# Patient Record
Sex: Female | Born: 1950 | Race: White | Hispanic: No | Smoking: Never smoker
Health system: Southern US, Community
[De-identification: ages and names within clinical notes are randomized; demographics above are authoritative.]

## PROBLEM LIST (undated history)

## (undated) DIAGNOSIS — I10 Essential (primary) hypertension: Secondary | ICD-10-CM

## (undated) DIAGNOSIS — E119 Type 2 diabetes mellitus without complications: Secondary | ICD-10-CM

## (undated) DIAGNOSIS — J449 Chronic obstructive pulmonary disease, unspecified: Secondary | ICD-10-CM

## (undated) DIAGNOSIS — J45909 Unspecified asthma, uncomplicated: Secondary | ICD-10-CM

## (undated) HISTORY — PX: TOTAL HIP ARTHROPLASTY: SHX124

## (undated) HISTORY — PX: REPLACEMENT TOTAL KNEE: SUR1224

## (undated) HISTORY — PX: TOTAL KNEE ARTHROPLASTY: SHX125

---

## 2015-12-28 ENCOUNTER — Emergency Department: Payer: Medicare Other

## 2015-12-28 ENCOUNTER — Inpatient Hospital Stay: Payer: Medicare Other

## 2015-12-28 ENCOUNTER — Inpatient Hospital Stay
Admission: EM | Admit: 2015-12-28 | Discharge: 2015-12-30 | DRG: 557 | Disposition: A | Discharge status: Home Health | Payer: Medicare Other | Attending: Internal Medicine | Admitting: Internal Medicine

## 2015-12-28 DIAGNOSIS — K859 Acute pancreatitis without necrosis or infection, unspecified: Secondary | ICD-10-CM | POA: Diagnosis present

## 2015-12-28 DIAGNOSIS — R109 Unspecified abdominal pain: Secondary | ICD-10-CM

## 2015-12-28 DIAGNOSIS — R7989 Other specified abnormal findings of blood chemistry: Secondary | ICD-10-CM | POA: Diagnosis not present

## 2015-12-28 DIAGNOSIS — W1830XA Fall on same level, unspecified, initial encounter: Secondary | ICD-10-CM | POA: Diagnosis present

## 2015-12-28 DIAGNOSIS — W19XXXA Unspecified fall, initial encounter: Secondary | ICD-10-CM

## 2015-12-28 DIAGNOSIS — E872 Acidosis: Secondary | ICD-10-CM | POA: Diagnosis present

## 2015-12-28 DIAGNOSIS — E119 Type 2 diabetes mellitus without complications: Secondary | ICD-10-CM | POA: Diagnosis present

## 2015-12-28 DIAGNOSIS — Z880 Allergy status to penicillin: Secondary | ICD-10-CM

## 2015-12-28 DIAGNOSIS — K760 Fatty (change of) liver, not elsewhere classified: Secondary | ICD-10-CM | POA: Diagnosis present

## 2015-12-28 DIAGNOSIS — M6282 Rhabdomyolysis: Principal | ICD-10-CM | POA: Diagnosis present

## 2015-12-28 DIAGNOSIS — I10 Essential (primary) hypertension: Secondary | ICD-10-CM | POA: Diagnosis present

## 2015-12-28 DIAGNOSIS — E279 Disorder of adrenal gland, unspecified: Secondary | ICD-10-CM | POA: Diagnosis present

## 2015-12-28 DIAGNOSIS — J449 Chronic obstructive pulmonary disease, unspecified: Secondary | ICD-10-CM | POA: Diagnosis present

## 2015-12-28 DIAGNOSIS — Z888 Allergy status to other drugs, medicaments and biological substances status: Secondary | ICD-10-CM

## 2015-12-28 DIAGNOSIS — R531 Weakness: Secondary | ICD-10-CM | POA: Diagnosis present

## 2015-12-28 DIAGNOSIS — M25552 Pain in left hip: Secondary | ICD-10-CM

## 2015-12-28 DIAGNOSIS — Y92002 Bathroom of unspecified non-institutional (private) residence single-family (private) house as the place of occurrence of the external cause: Secondary | ICD-10-CM

## 2015-12-28 DIAGNOSIS — M6281 Muscle weakness (generalized): Secondary | ICD-10-CM

## 2015-12-28 DIAGNOSIS — Z96642 Presence of left artificial hip joint: Secondary | ICD-10-CM | POA: Diagnosis present

## 2015-12-28 DIAGNOSIS — T730XXA Starvation, initial encounter: Secondary | ICD-10-CM | POA: Diagnosis present

## 2015-12-28 DIAGNOSIS — R748 Abnormal levels of other serum enzymes: Secondary | ICD-10-CM | POA: Diagnosis present

## 2015-12-28 DIAGNOSIS — K838 Other specified diseases of biliary tract: Secondary | ICD-10-CM | POA: Diagnosis present

## 2015-12-28 DIAGNOSIS — R935 Abnormal findings on diagnostic imaging of other abdominal regions, including retroperitoneum: Secondary | ICD-10-CM | POA: Diagnosis not present

## 2015-12-28 DIAGNOSIS — Y92009 Unspecified place in unspecified non-institutional (private) residence as the place of occurrence of the external cause: Secondary | ICD-10-CM

## 2015-12-28 DIAGNOSIS — Z7984 Long term (current) use of oral hypoglycemic drugs: Secondary | ICD-10-CM | POA: Diagnosis not present

## 2015-12-28 DIAGNOSIS — E876 Hypokalemia: Secondary | ICD-10-CM | POA: Diagnosis present

## 2015-12-28 DIAGNOSIS — E860 Dehydration: Secondary | ICD-10-CM | POA: Diagnosis present

## 2015-12-28 DIAGNOSIS — R945 Abnormal results of liver function studies: Secondary | ICD-10-CM

## 2015-12-28 DIAGNOSIS — Z96653 Presence of artificial knee joint, bilateral: Secondary | ICD-10-CM | POA: Diagnosis present

## 2015-12-28 HISTORY — DX: Essential (primary) hypertension: I10

## 2015-12-28 HISTORY — DX: Chronic obstructive pulmonary disease, unspecified: J44.9

## 2015-12-28 HISTORY — DX: Unspecified asthma, uncomplicated: J45.909

## 2015-12-28 HISTORY — DX: Type 2 diabetes mellitus without complications: E11.9

## 2015-12-28 LAB — CBC WITH DIFFERENTIAL/PLATELET
BASOS ABS: 0.1 10*3/uL (ref 0–0.1)
Basophils Relative: 1 %
EOS PCT: 0 %
Eosinophils Absolute: 0 10*3/uL (ref 0–0.7)
HCT: 44.5 % (ref 35.0–47.0)
Hemoglobin: 15.3 g/dL (ref 12.0–16.0)
LYMPHS PCT: 7 %
Lymphs Abs: 0.6 10*3/uL — ABNORMAL LOW (ref 1.0–3.6)
MCH: 30.9 pg (ref 26.0–34.0)
MCHC: 34.3 g/dL (ref 32.0–36.0)
MCV: 90.2 fL (ref 80.0–100.0)
MONO ABS: 0.7 10*3/uL (ref 0.2–0.9)
Monocytes Relative: 7 %
Neutro Abs: 8.2 10*3/uL — ABNORMAL HIGH (ref 1.4–6.5)
Neutrophils Relative %: 85 %
PLATELETS: 138 10*3/uL — AB (ref 150–440)
RBC: 4.94 MIL/uL (ref 3.80–5.20)
RDW: 14.8 % — AB (ref 11.5–14.5)
WBC: 9.7 10*3/uL (ref 3.6–11.0)

## 2015-12-28 LAB — GLUCOSE, CAPILLARY: GLUCOSE-CAPILLARY: 237 mg/dL — AB (ref 65–99)

## 2015-12-28 LAB — COMPREHENSIVE METABOLIC PANEL
ALT: 28 U/L (ref 14–54)
AST: 61 U/L — AB (ref 15–41)
Albumin: 3.9 g/dL (ref 3.5–5.0)
Alkaline Phosphatase: 101 U/L (ref 38–126)
Anion gap: 22 — ABNORMAL HIGH (ref 5–15)
BUN: 24 mg/dL — ABNORMAL HIGH (ref 6–20)
CHLORIDE: 94 mmol/L — AB (ref 101–111)
CO2: 19 mmol/L — ABNORMAL LOW (ref 22–32)
CREATININE: 0.89 mg/dL (ref 0.44–1.00)
Calcium: 9 mg/dL (ref 8.9–10.3)
GFR calc Af Amer: >60 mL/min (ref >60)
Glucose, Bld: 228 mg/dL — ABNORMAL HIGH (ref 65–99)
Potassium: 3.3 mmol/L — ABNORMAL LOW (ref 3.5–5.1)
Sodium: 135 mmol/L (ref 135–145)
TOTAL PROTEIN: 7.2 g/dL (ref 6.5–8.1)
Total Bilirubin: 3.2 mg/dL — ABNORMAL HIGH (ref 0.3–1.2)

## 2015-12-28 LAB — CK: CK TOTAL: 904 U/L — AB (ref 38–234)

## 2015-12-28 LAB — URINALYSIS, COMPLETE (UACMP) WITH MICROSCOPIC
Bilirubin Urine: NEGATIVE
Glucose, UA: >=500 mg/dL — AB
Ketones, ur: 80 mg/dL — AB
Leukocytes, UA: NEGATIVE
NITRITE: NEGATIVE
PROTEIN: 30 mg/dL — AB
Specific Gravity, Urine: 1.018 (ref 1.005–1.030)
pH: 6 (ref 5.0–8.0)

## 2015-12-28 LAB — ETHANOL: Alcohol, Ethyl (B): <5 mg/dL (ref <5)

## 2015-12-28 LAB — LIPASE, BLOOD: LIPASE: 130 U/L — AB (ref 11–51)

## 2015-12-28 MED ORDER — POTASSIUM CHLORIDE CRYS ER 20 MEQ PO TBCR
40.0000 meq | EXTENDED_RELEASE_TABLET | Freq: Once | ORAL | Status: AC
  Administered 2015-12-28: 40 meq via ORAL
  Filled 2015-12-28: qty 2

## 2015-12-28 MED ORDER — VENLAFAXINE HCL ER 75 MG PO CP24
300.0000 mg | ORAL_CAPSULE | Freq: Every day | ORAL | Status: DC
  Administered 2015-12-28 – 2015-12-30 (×3): 300 mg via ORAL
  Filled 2015-12-28 (×2): qty 4
  Filled 2015-12-28: qty 2
  Filled 2015-12-28 (×2): qty 4

## 2015-12-28 MED ORDER — OXYCODONE-ACETAMINOPHEN 5-325 MG PO TABS
ORAL_TABLET | ORAL | Status: AC
  Administered 2015-12-28: 1 via ORAL
  Filled 2015-12-28: qty 1

## 2015-12-28 MED ORDER — ONDANSETRON HCL 4 MG PO TABS: 4.0000 mg | ORAL_TABLET | Freq: Four times a day (QID) | ORAL | Status: DC | PRN

## 2015-12-28 MED ORDER — OXYCODONE-ACETAMINOPHEN 5-325 MG PO TABS
1.0000 | ORAL_TABLET | ORAL | Status: AC
  Administered 2015-12-28: 1 via ORAL

## 2015-12-28 MED ORDER — ONDANSETRON HCL 4 MG/2ML IJ SOLN: 4.0000 mg | Freq: Four times a day (QID) | INTRAMUSCULAR | Status: DC | PRN

## 2015-12-28 MED ORDER — IOPAMIDOL (ISOVUE-300) INJECTION 61%
15.0000 mL | Freq: Once | INTRAVENOUS | Status: AC | PRN
  Administered 2015-12-28: 15 mL via ORAL

## 2015-12-28 MED ORDER — ACETAMINOPHEN 325 MG PO TABS: 650.0000 mg | ORAL_TABLET | Freq: Four times a day (QID) | ORAL | Status: DC | PRN

## 2015-12-28 MED ORDER — IOPAMIDOL (ISOVUE-300) INJECTION 61%
100.0000 mL | Freq: Once | INTRAVENOUS | Status: AC | PRN
  Administered 2015-12-28: 100 mL via INTRAVENOUS

## 2015-12-28 MED ORDER — ACETAMINOPHEN 650 MG RE SUPP: 650.0000 mg | Freq: Four times a day (QID) | RECTAL | Status: DC | PRN

## 2015-12-28 MED ORDER — INSULIN ASPART 100 UNIT/ML ~~LOC~~ SOLN
3.0000 [IU] | Freq: Once | SUBCUTANEOUS | Status: AC
  Administered 2015-12-28: 3 [IU] via SUBCUTANEOUS
  Filled 2015-12-28: qty 3

## 2015-12-28 MED ORDER — SODIUM CHLORIDE 0.9 % IV BOLUS (SEPSIS)
1000.0000 mL | Freq: Once | INTRAVENOUS | Status: AC
  Administered 2015-12-28: 1000 mL via INTRAVENOUS

## 2015-12-28 MED ORDER — TRAMADOL HCL 50 MG PO TABS
50.0000 mg | ORAL_TABLET | Freq: Four times a day (QID) | ORAL | Status: DC | PRN
  Administered 2015-12-28: 50 mg via ORAL
  Filled 2015-12-28: qty 1

## 2015-12-28 MED ORDER — ENOXAPARIN SODIUM 40 MG/0.4ML ~~LOC~~ SOLN
40.0000 mg | SUBCUTANEOUS | Status: DC
  Administered 2015-12-28 – 2015-12-29 (×2): 40 mg via SUBCUTANEOUS
  Filled 2015-12-28 (×2): qty 0.4

## 2015-12-28 MED ORDER — INSULIN ASPART 100 UNIT/ML ~~LOC~~ SOLN
0.0000 [IU] | Freq: Three times a day (TID) | SUBCUTANEOUS | Status: DC
  Administered 2015-12-29: 2 [IU] via SUBCUTANEOUS
  Administered 2015-12-29 – 2015-12-30 (×2): 3 [IU] via SUBCUTANEOUS
  Administered 2015-12-30 (×2): 2 [IU] via SUBCUTANEOUS
  Filled 2015-12-28 (×3): qty 2
  Filled 2015-12-28 (×2): qty 3

## 2015-12-28 MED ORDER — SODIUM CHLORIDE 0.9 % IV SOLN
INTRAVENOUS | Status: DC
  Administered 2015-12-28 – 2015-12-30 (×2): via INTRAVENOUS

## 2015-12-28 MED ORDER — OXYCODONE-ACETAMINOPHEN 5-325 MG PO TABS
1.0000 | ORAL_TABLET | Freq: Once | ORAL | Status: AC
  Administered 2015-12-28: 1 via ORAL
  Filled 2015-12-28: qty 1

## 2015-12-28 MED ORDER — LISINOPRIL 10 MG PO TABS
10.0000 mg | ORAL_TABLET | Freq: Every day | ORAL | Status: DC
  Administered 2015-12-28 – 2015-12-29 (×2): 10 mg via ORAL
  Filled 2015-12-28 (×2): qty 1

## 2015-12-28 NOTE — ED Notes (Signed)
Patient transported to X-ray 

## 2015-12-28 NOTE — H&P (Signed)
[facility] [place] at [facility]   PATIENT NAME: Alyson Ki    MR#:  [id number]  DATE OF BIRTH:  [birth year]  DATE OF ADMISSION:  12/28/2015  PRIMARY CARE PHYSICIAN: No PCP Per Patient   REQUESTING/REFERRING PHYSICIAN: Dr Sharma Covert  CHIEF COMPLAINT:   I hurt all over. I fell in the bathroom she days ago. HISTORY OF PRESENT ILLNESS:  Jaydence Vanyo  is a 65 y.o. female with a known history of Diabetes, hypertension, COPD, history of alcohol use last drink 2 weeks ago comes to the emergency room after she had a fall 3 days ago in the bathroom. Patient recently moved from [facility] after she sold her house into an apartment and had a fall without any head injury. She crawled to the kitchen and kept herself hydrated by drinking Diet Coke. Her friend was not able to get in touch with her hands called the apartment manager got into the apartment where they found patient lying on the floor. She had some liquidy diarrheal stools for last 2 days. Patient reports she has not eaten for last 3 days. She tells me she was hurting in her hip so bad that she could not get up to get to the phone. Her x-rays of the hip are negative for any fracture. She was found to have elevated CPK is being admitted for acute rhabdomyolysis elevated LFTs and clinical dehydration.  PAST MEDICAL HISTORY:   Past Medical History:  Diagnosis Date  . Asthma   . COPD (chronic obstructive pulmonary disease) (HCC)   . Diabetes mellitus without complication (HCC)   . Hypertension     PAST SURGICAL HISTOIRY:   Past Surgical History:  Procedure Laterality Date  . REPLACEMENT TOTAL KNEE Left   . TOTAL HIP ARTHROPLASTY Left   . TOTAL HIP ARTHROPLASTY Left   . TOTAL KNEE ARTHROPLASTY Right     SOCIAL HISTORY:   Social History  Substance Use Topics  . Smoking status: Never Smoker  . Smokeless tobacco: Never Used  . Alcohol use No    FAMILY HISTORY:   Family History  Problem Relation Age  of Onset  . Heart attack Father     DRUG ALLERGIES:   Allergies  Allergen Reactions  . Penicillins Shortness Of Breath  . Amoxicillin Rash  . Ciprofloxacin Rash    REVIEW OF SYSTEMS:  Review of Systems  Constitutional: Positive for malaise/fatigue. Negative for chills, fever and weight loss.  HENT: Negative for ear discharge, ear pain and nosebleeds.   Eyes: Negative for blurred vision, pain and discharge.  Respiratory: Negative for sputum production, shortness of breath, wheezing and stridor.   Cardiovascular: Negative for chest pain, palpitations, orthopnea and PND.  Gastrointestinal: Positive for abdominal pain and diarrhea. Negative for nausea and vomiting.  Genitourinary: Negative for frequency and urgency.  Musculoskeletal: Positive for falls. Negative for back pain and joint pain.  Neurological: Positive for weakness. Negative for sensory change, speech change and focal weakness.  Psychiatric/Behavioral: Negative for depression and hallucinations. The patient is not nervous/anxious.      MEDICATIONS AT HOME:   Prior to Admission medications   Medication Sig Start Date End Date Taking? Authorizing Provider  hydrochlorothiazide (HYDRODIURIL) 25 MG tablet Take 12.5 mg by mouth daily.   Yes Historical Provider, MD  lisinopril (PRINIVIL,ZESTRIL) 10 MG tablet Take 10 mg by mouth daily.   Yes Historical Provider, MD  metFORMIN (GLUCOPHAGE) 500 MG tablet Take 500 mg by mouth 2 (two) times daily.  Yes Historical Provider, MD  traZODone (DESYREL) 50 MG tablet Take 100 mg by mouth at bedtime.   Yes Historical Provider, MD  venlafaxine (EFFEXOR) 75 MG tablet Take 300 mg by mouth daily. Take 4 caps (300mg ) daily   Yes Historical Provider, MD      VITAL SIGNS:  Blood pressure (!) 147/90, pulse (!) 113, temperature 97.8 F (36.6 C), temperature source Oral, resp. rate 20, height 5' (1.524 m), weight 90.7 kg (200 lb), SpO2 100 %.  PHYSICAL EXAMINATION:  GENERAL:  65 y.o.-year-old  patient lying in the bed with no acute distress.  EYES: Pupils equal, round, reactive to light and accommodation. No scleral icterus. Extraocular muscles intact.  HEENT: Head atraumatic, normocephalic. Oropharynx and nasopharynx clear. Oral mucosa dry NECK:  Supple, no jugular venous distention. No thyroid enlargement, no tenderness.  LUNGS: Normal breath sounds bilaterally, no wheezing, rales,rhonchi or crepitation. No use of accessory muscles of respiration.  CARDIOVASCULAR: S1, S2 normal. No murmurs, rubs, or gallops.  ABDOMEN: Soft, nontender, nondistended. Bowel sounds present. No organomegaly or mass.  EXTREMITIES: No pedal edema, cyanosis, or clubbing.  NEUROLOGIC: Cranial nerves II through XII are intact. Muscle strength 5/5 in all extremities. Sensation intact. Gait not checked.  PSYCHIATRIC: The patient is alert and oriented x 3.  SKIN: No obvious rash, lesion, or ulcer.   LABORATORY PANEL:   CBC  Recent Labs Lab 12/28/15 1407  WBC 9.7  HGB 15.3  HCT 44.5  PLT 138*   ------------------------------------------------------------------------------------------------------------------  Chemistries   Recent Labs Lab 12/28/15 1407  NA 135  K 3.3*  CL 94*  CO2 19*  GLUCOSE 228*  BUN 24*  CREATININE 0.89  CALCIUM 9.0  AST 61*  ALT 28  ALKPHOS 101  BILITOT 3.2*   ------------------------------------------------------------------------------------------------------------------  Cardiac Enzymes No results for input(s): TROPONINI in the last 168 hours. ------------------------------------------------------------------------------------------------------------------  RADIOLOGY:  Dg Chest 1 View  Result Date: 12/28/2015 CLINICAL DATA:  Fall and generalized weakness. EXAM: CHEST 1 VIEW COMPARISON:  None. FINDINGS: The heart size and mediastinal contours are within normal limits. Lungs show bibasilar atelectasis. There is no evidence of pulmonary edema, consolidation,  pneumothorax, nodule or pleural fluid. Healed bilateral rib fractures evident. IMPRESSION: No acute findings.  Bibasilar atelectasis. Electronically Signed   By: Irish LackGlenn  Yamagata M.D.   On: 12/28/2015 13:22   Ct Abdomen Pelvis W Contrast  Result Date: 12/28/2015 CLINICAL DATA:  Fall with weakness, nausea and vomiting EXAM: CT ABDOMEN AND PELVIS WITH CONTRAST TECHNIQUE: Multidetector CT imaging of the abdomen and pelvis was performed using the standard protocol following bolus administration of intravenous contrast. CONTRAST:  100mL ISOVUE-300 IOPAMIDOL (ISOVUE-300) INJECTION 61% COMPARISON:  Radiographs 12/28/2015 FINDINGS: Lower chest: Visualized lung bases demonstrate no acute consolidation or pleural effusion. Hepatobiliary: There is diffuse decreased density of the hepatic parenchyma, consistent with fatty infiltration. No focal hepatic abnormality. There is somewhat small appearance of the right hepatic lobe, correlate with surgical history. Irregular enhancement within the right hepatic lobe this felt to relate to unusual appearance of vascular enhancement, likely hepatic venous. There is no focal hepatic abnormality. Dilated extrahepatic common bile duct, measuring up to 2 cm in diameter. A well-formed gallbladder is not clearly identified. There is slight prominence of the pancreatic duct at the head of the pancreas. No calcified stones. Pancreas: Atrophic. Prominent pancreatic duct. There is a 1.5 cm cystic appearing lesion in the tail of the pancreas, series 2, image number 22. No peripancreatic inflammation. Spleen: Normal in size without focal abnormality. Adrenals/Urinary  Tract: Left adrenal gland is within normal limits. 2.6 cm right adrenal gland mass without demonstrable fatty density. Bilateral kidneys are within normal limits. Bladder is largely obscured by streak artifact from hip replacement. Stomach/Bowel: Stomach is nonenlarged. No dilated bowel. Diverticular disease of the colon without  acute inflammation. Appendix is not well visualized. Vascular/Lymphatic: Tortuous pelvic and gonadal vessels. Atherosclerosis of the aorta. No abnormally enlarged lymph nodes. Reproductive: Uterus and bilateral adnexa are unremarkable. Other: No free air or free fluid Musculoskeletal: Bilateral hip replacements. Deformity of L2 vertebral body with osseous bridging anteriorly at L1 and L2, possibly related to old trauma or congenital malformation. IMPRESSION: 1. Fatty infiltration of the liver. 2. Markedly enlargement of extrahepatic common bowel duct up to 2 cm with slight prominence of pancreatic duct. Recommend correlation with laboratory values to evaluate for obstruction. Further evaluation with MR or ERCP may be obtained as clinically indicated 3. 2.6 cm right adrenal gland mass. Nonemergent evaluation with adrenal CT or MR may be obtained. 4. Atrophic pancreas. 1.5 cm cystic lesion in the tail of the pancreas, for which nonemergent follow-up CT or MRI pancreas protocol is recommended. Electronically Signed   By: Jasmine PangKim  Fujinaga M.D.   On: 12/28/2015 16:48   Dg Hip Unilat W Or Wo Pelvis 2-3 Views Left  Result Date: 12/28/2015 CLINICAL DATA:  The patient slipped in a bathroom 3 days ago resulting in a fall and left hip pain. Initial encounter. EXAM: DG HIP (WITH OR WITHOUT PELVIS) 2-3V LEFT COMPARISON:  None. FINDINGS: Bilateral total hip replacements are in place. Hardware is intact. No acute abnormality is identified. IMPRESSION: No acute abnormality. Status post bilateral total hip replacement. Electronically Signed   By: Drusilla Kannerhomas  Dalessio M.D.   On: 12/28/2015 13:22   Dg Femur Min 2 Views Right  Result Date: 12/28/2015 CLINICAL DATA:  Right hip and upper leg pain since a fall in bathroom 3 days ago. Initial encounter. EXAM: RIGHT FEMUR 2 VIEWS COMPARISON:  None. FINDINGS: No acute bony or joint abnormality is identified. Right total hip and knee replacements are in place. Hardware is intact.  IMPRESSION: No acute abnormality. Electronically Signed   By: Drusilla Kannerhomas  Dalessio M.D.   On: 12/28/2015 13:24    EKG:    IMPRESSION AND PLAN:   Jimmye Normanhyllis Schnitzler  is a 65 y.o. female with a known history of Diabetes, hypertension, COPD, history of alcohol use last drink 2 weeks ago comes to the emergency room after she had a fall 3 days ago in the bathroom. [place] recently moved [place] [facility] after she sold her house into an apartment and had a fall without any head injury.   1. Acute  Rhabdomyolysis due to fall and crawling with laying in the kitchen and living area for 3 days -Admit to medical floor -IV fluids -Monitor I's and O's and creatinine  2. Hypokalemia -Replete with IV fluids  3. Elevated LFT with dilated CBD seen on CT of the abdomen along with right upper quadrant abdominal pain -Patient has mild elevated lipase. -pt has h/o drinking ETOH. Last drink 2 weeks ago. -get US of the abdomen -consider GI consult depending on US results  4.DM-2  SSI  -hold metformin due to acidosis  5. Clinical dehydration with acidosis which appears due to  Starvation (not eatin for 3 days) -monitor AG  6.DVT prophylaxis -SQ lovenox  No family in the ER  CM and PT to see All the records are reviewed and case discussed with ED provider. Management plans  discussed with the patient, family and they are in agreement.  CODE STATUS: FULL  TOTAL TIME TAKING CARE OF THIS PATIENT: 50 minutes.    Angelic Schnelle M.D on 12/28/2015 at 6:08 PM  Between 7am to 6pm - Pager - [phone]  After 6pm go to [email or web address] - password [email or web address] [facility]  [facility]  Office  [phone]  CC: Primary care physician; No PCP Per Patient

## 2015-12-28 NOTE — ED Triage Notes (Signed)
Pt to ED via [facility] c/o fall. Per EMS pt fell a couple days ago, and was unable to get up. Pt reports slipping in bathroom and unable to get up, but was able to crawl to her kitchen from her bathroom, and back. She also c/o back and bilateral leg pain, nausea and diarrhea. Pt alert and oriented in no acute distress at this time.

## 2015-12-28 NOTE — ED Provider Notes (Signed)
[facility] Emergency Department Provider Note  ____________________________________________  Time seen: Approximately 3:19 PM  I have reviewed the triage vital signs and the nursing notes.   HISTORY  Chief Complaint Fall    HPI Shelby Stewart is a 65 y.o. female comes ED complaining of low back pain and bilateral hip pain after a fall at home. She reports that she's been having generalized weakness over the last several days, and 3 days ago she had a fall in her bathroom. She was unable to get back up so she laid on the floor for 3 days until her daughter came to visit her today. She was able to crawl to the ktichen to drink diet cokes to maintain hydration and then would crawl back to the living room to continue lying on the floor.  Denies head injury headache or loss of consciousness. No neck pain or stiffness. Positive generalized abdominal pain.     Past Medical History:  Diagnosis Date  . COPD (chronic obstructive pulmonary disease) (HCC)   . Diabetes mellitus without complication (HCC)   . Hypertension      There are no active problems to display for this patient.    Past Surgical History:  Procedure Laterality Date  . REPLACEMENT TOTAL KNEE Left   . TOTAL HIP ARTHROPLASTY Left   . TOTAL HIP ARTHROPLASTY Left   . TOTAL KNEE ARTHROPLASTY Right      Prior to Admission medications   Not on File     Allergies Penicillins; Amoxicillin; and Ciprofloxacin   Family History  Problem Relation Age of Onset  . Heart attack Father     Social History Social History  Substance Use Topics  . Smoking status: Never Smoker  . Smokeless tobacco: Never Used  . Alcohol use No    Review of Systems  Constitutional:   No fever or chills.  ENT:   No sore throat. No rhinorrhea. Cardiovascular:   No chest pain. Respiratory:   No dyspnea or cough. Gastrointestinal:   positive abdominal pain. Positive diarrhea 2 days.  Genitourinary:    Negative for dysuria or difficulty urinating. Musculoskeletal:   Positive bilateral hip pain and low back pain Neurological:   Negative for headaches 10-point ROS otherwise negative.  ____________________________________________   PHYSICAL EXAM:  VITAL SIGNS: ED Triage Vitals  Enc Vitals Group     BP 12/28/15 1214 (!) 155/92     Pulse Rate 12/28/15 1214 (!) 103     Resp 12/28/15 1214 20     Temp 12/28/15 1214 97.8 F (36.6 C)     Temp Source 12/28/15 1214 Oral     SpO2 12/28/15 1214 99 %     Weight 12/28/15 1215 200 lb (90.7 kg)     Height 12/28/15 1215 5' (1.524 m)     Head Circumference --      Peak Flow --      Pain Score 12/28/15 1215 8     Pain Loc --      Pain Edu? --      Excl. in GC? --     Vital signs reviewed, nursing assessments reviewed.   Constitutional:   Alert and oriented. Well appearing and in no distress. Eyes:   No scleral icterus. No conjunctival pallor. PERRL. EOMI.  No nystagmus. ENT   Head:   Normocephalic and atraumatic.   Nose:   No congestion/rhinnorhea. No septal hematoma   Mouth/Throat:   dRy mucous membranes, no pharyngeal erythema. No peritonsillar mass.  Neck:   No stridor. No SubQ emphysema. No meningismus. Hematological/Lymphatic/Immunilogical:   No cervical lymphadenopathy. Cardiovascular:   Tachycardia heart rate 105. Symmetric bilateral radial and DP pulses.  No murmurs.  Respiratory:   Normal respiratory effort without tachypnea nor retractions. Breath sounds are clear and equal bilaterally. No wheezes/rales/rhonchi. Gastrointestinal:   Soft with generalized tenderness. Non distended. There is no CVA tenderness.  No rebound, rigidity, or guarding. Genitourinary:   deferred Musculoskeletal:   Tenderness of bilateral hips. No midline spinal tenderness. Intact range of motion, patient is able to lift both legs. Neurologic:   Normal speech and language.  CN 2-10 normal. Motor grossly intact. No gross focal neurologic  deficits are appreciated.  Skin:    Skin is warm, dry and intact. No rash noted.  No petechiae, purpura, or bullae.  ____________________________________________    LABS (pertinent positives/negatives) (all labs ordered are listed, but only abnormal results are displayed) Labs Reviewed  COMPREHENSIVE METABOLIC PANEL - Abnormal; Notable for the following:       Result Value   Potassium 3.3 (*)    Chloride 94 (*)    CO2 19 (*)    Glucose, Bld 228 (*)    BUN 24 (*)    AST 61 (*)    Total Bilirubin 3.2 (*)    Anion gap 22 (*)    All other components within normal limits  LIPASE, BLOOD - Abnormal; Notable for the following:    Lipase 130 (*)    All other components within normal limits  CBC WITH DIFFERENTIAL/PLATELET - Abnormal; Notable for the following:    RDW 14.8 (*)    Platelets 138 (*)    Neutro Abs 8.2 (*)    Lymphs Abs 0.6 (*)    All other components within normal limits  URINALYSIS, COMPLETE (UACMP) WITH MICROSCOPIC - Abnormal; Notable for the following:    Color, Urine YELLOW (*)    APPearance CLEAR (*)    Glucose, UA >=500 (*)    Hgb urine dipstick SMALL (*)    Ketones, ur 80 (*)    Protein, ur 30 (*)    Bacteria, UA MANY (*)    Squamous Epithelial / LPF 0-5 (*)    All other components within normal limits  CK - Abnormal; Notable for the following:    Total CK 904 (*)    All other components within normal limits  URINE CULTURE  ETHANOL   ____________________________________________   EKG    ____________________________________________    RADIOLOGY  X-ray pelvis and left hip unremarkable X-ray right femur unremarkable Chest x-ray unremarkable CT abdomen and pelvis pending  ____________________________________________   PROCEDURES Procedures  ____________________________________________   INITIAL IMPRESSION / ASSESSMENT AND PLAN / ED COURSE  Pertinent labs & imaging results that were available during my care of the patient were reviewed by  me and considered in my medical decision making (see chart for details).  Patient presents with muscle skeletal pain and abdominal pain after falling on the ground and remaining on the ground for 3 days due to generalized weakness. Has some evidence of dehydration, we'll give IV fluids. Labs reveal elevated CK and lipase. Creatinine is preserved, but there is concern for rhabdomyolysis.  Concern for biliary pathology with diarrhea elevated lipase elevated bilirubin abdominal pain. We'll get a CT abdomen and pelvis.    ----------------------------------------- 3:55 PM on 12/28/2015 ----------------------------------------- Case discussed with hospitalist for eventual admission due to dehydration, rhabdomyolysis, pancreatitis in the setting of generalized weakness. Case discussed with Dr.  Sharma Covertorman to follow up on CT prior to admission.   Clinical Course    ____________________________________________   FINAL CLINICAL IMPRESSION(S) / ED DIAGNOSES  Final diagnoses:  Fall in home, initial encounter  Generalized weakness  Non-traumatic rhabdomyolysis  Acute pancreatitis, unspecified complication status, unspecified pancreatitis type      New Prescriptions   No medications on file     Portions of this note were generated with dragon dictation software. Dictation errors may occur despite best attempts at proofreading.    Sharman CheekPhillip Telecia Larocque, MD 12/28/15 [phone]

## 2015-12-29 ENCOUNTER — Inpatient Hospital Stay: Payer: Medicare Other

## 2015-12-29 DIAGNOSIS — R935 Abnormal findings on diagnostic imaging of other abdominal regions, including retroperitoneum: Secondary | ICD-10-CM

## 2015-12-29 DIAGNOSIS — R7989 Other specified abnormal findings of blood chemistry: Secondary | ICD-10-CM

## 2015-12-29 LAB — COMPREHENSIVE METABOLIC PANEL
ALT: 21 U/L (ref 14–54)
ANION GAP: 13 (ref 5–15)
AST: 40 U/L (ref 15–41)
Albumin: 3.1 g/dL — ABNORMAL LOW (ref 3.5–5.0)
Alkaline Phosphatase: 73 U/L (ref 38–126)
BUN: 17 mg/dL (ref 6–20)
CHLORIDE: 99 mmol/L — AB (ref 101–111)
CO2: 22 mmol/L (ref 22–32)
Calcium: 8 mg/dL — ABNORMAL LOW (ref 8.9–10.3)
Creatinine, Ser: 0.75 mg/dL (ref 0.44–1.00)
Glucose, Bld: 179 mg/dL — ABNORMAL HIGH (ref 65–99)
POTASSIUM: 3.4 mmol/L — AB (ref 3.5–5.1)
Sodium: 134 mmol/L — ABNORMAL LOW (ref 135–145)
TOTAL PROTEIN: 5.6 g/dL — AB (ref 6.5–8.1)
Total Bilirubin: 2.4 mg/dL — ABNORMAL HIGH (ref 0.3–1.2)

## 2015-12-29 LAB — GLUCOSE, CAPILLARY
GLUCOSE-CAPILLARY: 213 mg/dL — AB (ref 65–99)
GLUCOSE-CAPILLARY: 179 mg/dL — AB (ref 65–99)
Glucose-Capillary: 188 mg/dL — ABNORMAL HIGH (ref 65–99)

## 2015-12-29 LAB — BILIRUBIN, FRACTIONATED(TOT/DIR/INDIR)
BILIRUBIN DIRECT: 0.6 mg/dL — AB (ref 0.1–0.5)
BILIRUBIN INDIRECT: 1.4 mg/dL — AB (ref 0.3–0.9)
BILIRUBIN TOTAL: 2 mg/dL — AB (ref 0.3–1.2)

## 2015-12-29 MED ORDER — POTASSIUM CHLORIDE CRYS ER 20 MEQ PO TBCR
20.0000 meq | EXTENDED_RELEASE_TABLET | Freq: Two times a day (BID) | ORAL | Status: AC
  Administered 2015-12-29 (×2): 20 meq via ORAL
  Filled 2015-12-29 (×2): qty 1

## 2015-12-29 MED ORDER — SULFAMETHOXAZOLE-TRIMETHOPRIM 800-160 MG PO TABS
1.0000 | ORAL_TABLET | Freq: Two times a day (BID) | ORAL | Status: DC
  Administered 2015-12-29 – 2015-12-30 (×2): 1 via ORAL
  Filled 2015-12-29 (×2): qty 1

## 2015-12-29 NOTE — Progress Notes (Signed)
Pt ambulated around the unit with physical therapy, stated she tolerated this well, but feels tired afterwards.

## 2015-12-29 NOTE — Plan of Care (Signed)
Problem: Bowel/Gastric: Goal: Will not experience complications related to bowel motility Outcome: Progressing Pt is progressing toward goals of discharge. Pt remains free of falls/injury this shift, has been cooperative with testing and care. Pt is aware of plan of care.

## 2015-12-29 NOTE — Progress Notes (Signed)
Initial Nutrition Assessment  DOCUMENTATION CODES:   Obesity unspecified  INTERVENTION:  1. Ensure Enlive po BID, each supplement provides 350 kcal and 20 grams of protein  NUTRITION DIAGNOSIS:   Inadequate oral intake related to poor appetite, other (see comment) (attempting weight loss) as evidenced by per patient/family report.  GOAL:   Patient will meet greater than or equal to 90% of their needs  MONITOR:   PO intake, I & O's, Labs, Weight trends, Supplement acceptance  REASON FOR ASSESSMENT:   Malnutrition Screening Tool   ASSESSMENT:   Shelby Stewart  is a 65 y.o. female with a known history of Diabetes, hypertension, COPD, history of alcohol use last drink 2 weeks ago comes to the emergency room after she had a fall 3 days ago in the bathroom.  Spoke with pt at bedside. She reports weight fluctuations - but that she generally weights 210-220# Apparently she was trying to lose weight, has lost weight down from 300# to where she currently is. Prior to her fall - she had not eaten anything for "a few days." Patient reports that she was trying to lose weight and when she eats she feels a desire to continue to eat so she tends to not eat anything.  This seems along the lines of the disordered eating spectrum - no evidence of history of this in chart.  This morning she 1/2 a cup of soup - states she got full quickly. Appetite is ok. Complains of some bloating - looking at her abdomen it is distended, kind of taut. Patient states she hasn't had a BM in a few days.  Nutrition-Focused physical exam completed. Findings are no fat depletion, no muscle depletion, and no edema.   Labs and medications reviewed: Na 134, K 3.4, Tot bili 2.4 NS @ 6575mL/hr  Diet Order:  Diet clear liquid Room service appropriate? Yes; Fluid consistency: Thin  Skin:  Reviewed, no issues  Last BM:  12/26/2015  Height:   Ht Readings from Last 1 Encounters:  12/28/15 5' (1.524 m)    Weight:    Wt Readings from Last 1 Encounters:  12/28/15 212 lb 11.2 oz (96.5 kg)    Ideal Body Weight:  45.45 kg  BMI:  Body mass index is 41.54 kg/m.  Estimated Nutritional Needs:   Kcal:  1455-1755 calories  Protein:  96-116 gm  Fluid:  >/= 1.45L  EDUCATION NEEDS:   No education needs identified at this time  Dionne AnoWilliam M. Elzy Tomasello, MS, RD LDN Inpatient Clinical Dietitian Pager [phone]

## 2015-12-29 NOTE — Progress Notes (Signed)
New Admit  Arrival Method: ED Mental Orientation: Alert and oriented X4 Telemetry: No Assessment: Clear lung sounds, no adventitious heart sounds, voiding clear yellow urine. Skin: Bruises on arms bilaterally, Generalized redness of skin on back and legs, Moisture associated skin damage to inner buttocks- cleansed and barrier cream applied. Iv: Right antecubital Safety Measures: Bed alarm on, yellow socks, Patient oriented to unit and equipment. Admission: Complete

## 2015-12-29 NOTE — Progress Notes (Signed)
Physical Therapy Evaluation Patient Details Name: Shelby Stewart MRN: [id number] DOB: Oct 15, 1950 Today's Date: 12/29/2015   History of Present Illness  Pt admitted for elevated LFTs and complaints of falls in a bathroom. Pt with history of DM, HTN, COPD, and alcohol abuse.  Clinical Impression  Pt is a pleasant 65 year old female who was admitted for elevated LFTs. Pt performs bed mobility with mod I and transfers/ambulation with cga and RW. Pt demonstrates deficits with strength/mobility/endurance/pain. Pt is near baseline level. Would benefit from skilled PT to address above deficits and promote optimal return to PLOF. Recommend transition to HHPT upon discharge from acute hospitalization.       Follow Up Recommendations Home health PT    Equipment Recommendations  None recommended by PT    Recommendations for Other Services       Precautions / Restrictions Precautions Precautions: Fall Restrictions Weight Bearing Restrictions: No      Mobility  Bed Mobility Overal bed mobility: Modified Independent             General bed mobility comments: safe technique performed, slightly impulsive  Transfers Overall transfer level: Needs assistance Equipment used: Rolling walker (2 wheeled) Transfers: Sit to/from Stand Sit to Stand: Min guard         General transfer comment: safe technique performed with upright posture noted. Once standing, reports increased pain on L hip.  Ambulation/Gait Ambulation/Gait assistance: Min guard Ambulation Distance (Feet): 120 Feet Assistive device: Rolling walker (2 wheeled) Gait Pattern/deviations: Step-through pattern     General Gait Details: ambulated using reciprocal gait pattern and safe technique. Pt able to carry conversation during ambulation. Increased pain noted limiting further distance.   Stairs            Wheelchair Mobility    Modified Rankin (Stroke Patients Only)       Balance Overall balance  assessment: Needs assistance Sitting-balance support: Feet supported Sitting balance-Leahy Scale: Normal     Standing balance support: Bilateral upper extremity supported Standing balance-Leahy Scale: Good                               Pertinent Vitals/Pain Pain Assessment: 0-10 Pain Score: 7  Pain Location: L hip Pain Descriptors / Indicators: Dull;Discomfort Pain Intervention(s): Limited activity within patient's tolerance    Home Living Family/patient expects to be discharged to:: Private residence Living Arrangements: Alone   Type of Home: House Home Access: Stairs to enter Entrance Stairs-Rails: Right Entrance Stairs-Number of Steps: 3 Home Layout: Two level Home Equipment: Environmental consultantWalker - 2 wheels;Cane - single point;Shower seat      Prior Function Level of Independence: Independent with assistive device(s)         Comments: uses SPC     Hand Dominance        Extremity/Trunk Assessment   Upper Extremity Assessment: Overall WFL for tasks assessed           Lower Extremity Assessment: Generalized weakness (B LE grossly 4+/5)         Communication   Communication: No difficulties  Cognition Arousal/Alertness: Awake/alert Behavior During Therapy: WFL for tasks assessed/performed Overall Cognitive Status: Within Functional Limits for tasks assessed                      General Comments      Exercises Other Exercises Other Exercises: Supine/seated ther-ex performed including B LE alt. marches, LAQ, hip ab/ad, and ankle  pumps. All ther-ex performed x 10 reps with supervision and safe technique   Assessment/Plan    PT Assessment Patient needs continued PT services  PT Problem List Decreased strength;Pain;Decreased mobility          PT Treatment Interventions Gait training;Stair training;Therapeutic exercise    PT Goals (Current goals can be found in the Care Plan section)  Acute Rehab PT Goals Patient Stated Goal: to go  home PT Goal Formulation: With patient Time For Goal Achievement: 01/12/16 Potential to Achieve Goals: Good    Frequency Min 2X/week   Barriers to discharge        Co-evaluation               End of Session Equipment Utilized During Treatment: Gait belt Activity Tolerance: Patient limited by pain Patient left: in chair;with chair alarm set Nurse Communication: Mobility status         Time: [id number] PT Time Calculation (min) (ACUTE ONLY): 23 min   Charges:   PT Evaluation $PT Eval Moderate Complexity: 1 Procedure PT Treatments $Therapeutic Exercise: 8-22 mins   PT G Codes:        Shelby Stewart 12/29/2015, 3:53 PM  Elizabeth PalauStephanie Jasper Stewart, PT, DPT [phone]

## 2015-12-29 NOTE — Progress Notes (Signed)
[facility] - [place] [facility]   PATIENT NAME: Shelby Stewart    MR#:  [id number]  DATE OF BIRTH:  [birth year]  SUBJECTIVE:  CHIEF COMPLAINT:   Chief Complaint  Patient presents with  . Fall     Patient is not a great historian, came after a fall and could not get up from the floor for 3 days, she kept drinking liquids and crawling on the floor but did not call for any help. She denies any abdominal pain but found to have elevated LFTs and high CK level with elevated lipase level also. Other workup on CT abdomen and ultrasound abdomen was common bile duct was noted to be severely Dilated. Tolerating liquid diet now. , Workup was negative for any acute fractures, she still to complain of lower back and leg pains.  REVIEW OF SYSTEMS:  CONSTITUTIONAL: No fever, fatigue or weakness.  EYES: No blurred or double vision.  EARS, NOSE, AND THROAT: No tinnitus or ear pain.  RESPIRATORY: No cough, shortness of breath, wheezing or hemoptysis.  CARDIOVASCULAR: No chest pain, orthopnea, edema.  GASTROINTESTINAL: No nausea, vomiting, diarrhea or abdominal pain.  GENITOURINARY: No dysuria, hematuria.  ENDOCRINE: No polyuria, nocturia,  HEMATOLOGY: No anemia, easy bruising or bleeding SKIN: No rash or lesion. MUSCULOSKELETAL: c/o back pain.   NEUROLOGIC: No tingling, numbness, weakness.  PSYCHIATRY: No anxiety or depression.   ROS  DRUG ALLERGIES:   Allergies  Allergen Reactions  . Penicillins Shortness Of Breath  . Amoxicillin Rash  . Ciprofloxacin Rash    VITALS:  Blood pressure 108/60, pulse 87, temperature 97.9 F (36.6 C), temperature source Oral, resp. rate 16, height 5' (1.524 m), weight 96.5 kg (212 lb 11.2 oz), SpO2 98 %.  PHYSICAL EXAMINATION:  GENERAL:  65 y.o.-year-old patient lying in the bed with no acute distress.  EYES: Pupils equal, round, reactive to light and accommodation. No scleral icterus. Extraocular muscles intact.  HEENT: Head atraumatic,  normocephalic. Oropharynx and nasopharynx clear.  NECK:  Supple, no jugular venous distention. No thyroid enlargement, no tenderness.  LUNGS: Normal breath sounds bilaterally, no wheezing, rales,rhonchi or crepitation. No use of accessory muscles of respiration.  CARDIOVASCULAR: S1, S2 normal. No murmurs, rubs, or gallops.  ABDOMEN: Soft, nontender, nondistended. Bowel sounds present. No organomegaly or mass.  EXTREMITIES: No pedal edema, cyanosis, or clubbing.  NEUROLOGIC: Cranial nerves II through XII are intact. Muscle strength 4/5 in all extremities. Sensation intact. Gait not checked. She complains of lower back pain and was not getting up to go to the bathroom on her own but on straight leg rising while lying in the bed, she did not had any travel or worsening of the pain. PSYCHIATRIC: The patient is alert and oriented x 3.  SKIN: No obvious rash, lesion, or ulcer.   Physical Exam LABORATORY PANEL:   CBC  Recent Labs Lab 12/28/15 1407  WBC 9.7  HGB 15.3  HCT 44.5  PLT 138*   ------------------------------------------------------------------------------------------------------------------  Chemistries   Recent Labs Lab 12/29/15 0602 12/29/15 1425  NA 134*  --   K 3.4*  --   CL 99*  --   CO2 22  --   GLUCOSE 179*  --   BUN 17  --   CREATININE 0.75  --   CALCIUM 8.0*  --   AST 40  --   ALT 21  --   ALKPHOS 73  --   BILITOT 2.4* 2.0*   ------------------------------------------------------------------------------------------------------------------  Cardiac Enzymes No results for input(s):  TROPONINI in the last 168 hours. ------------------------------------------------------------------------------------------------------------------  RADIOLOGY:  Dg Chest 1 View  Result Date: 12/28/2015 CLINICAL DATA:  Fall and generalized weakness. EXAM: CHEST 1 VIEW COMPARISON:  None. FINDINGS: The heart size and mediastinal contours are within normal limits. Lungs show  bibasilar atelectasis. There is no evidence of pulmonary edema, consolidation, pneumothorax, nodule or pleural fluid. Healed bilateral rib fractures evident. IMPRESSION: No acute findings.  Bibasilar atelectasis. Electronically Signed   By: Irish LackGlenn  Yamagata M.D.   On: 12/28/2015 13:22   Ct Abdomen Pelvis W Contrast  Result Date: 12/28/2015 CLINICAL DATA:  Fall with weakness, nausea and vomiting EXAM: CT ABDOMEN AND PELVIS WITH CONTRAST TECHNIQUE: Multidetector CT imaging of the abdomen and pelvis was performed using the standard protocol following bolus administration of intravenous contrast. CONTRAST:  100mL ISOVUE-300 IOPAMIDOL (ISOVUE-300) INJECTION 61% COMPARISON:  Radiographs 12/28/2015 FINDINGS: Lower chest: Visualized lung bases demonstrate no acute consolidation or pleural effusion. Hepatobiliary: There is diffuse decreased density of the hepatic parenchyma, consistent with fatty infiltration. No focal hepatic abnormality. There is somewhat small appearance of the right hepatic lobe, correlate with surgical history. Irregular enhancement within the right hepatic lobe this felt to relate to unusual appearance of vascular enhancement, likely hepatic venous. There is no focal hepatic abnormality. Dilated extrahepatic common bile duct, measuring up to 2 cm in diameter. A well-formed gallbladder is not clearly identified. There is slight prominence of the pancreatic duct at the head of the pancreas. No calcified stones. Pancreas: Atrophic. Prominent pancreatic duct. There is a 1.5 cm cystic appearing lesion in the tail of the pancreas, series 2, image number 22. No peripancreatic inflammation. Spleen: Normal in size without focal abnormality. Adrenals/Urinary Tract: Left adrenal gland is within normal limits. 2.6 cm right adrenal gland mass without demonstrable fatty density. Bilateral kidneys are within normal limits. Bladder is largely obscured by streak artifact from hip replacement. Stomach/Bowel: Stomach  is nonenlarged. No dilated bowel. 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 Appendix is not well visualized. Vascular/Lymphatic: Tortuous pelvic and gonadal vessels. Atherosclerosis of the aorta. No abnormally enlarged lymph nodes. Reproductive: Uterus and bilateral adnexa are unremarkable. Other: No free air or free fluid Musculoskeletal: Bilateral hip replacements. Deformity of L2 vertebral body with osseous bridging anteriorly at L1 and L2, possibly related to old trauma or congenital malformation. IMPRESSION: 1. Fatty infiltration of the liver. 2. Markedly enlargement of extrahepatic common bowel duct up to 2 cm with slight prominence of pancreatic duct. Recommend correlation with laboratory values to evaluate for obstruction. Further evaluation with MR or ERCP may be obtained as clinically indicated 3. 2.6 cm right adrenal gland mass. Nonemergent evaluation with adrenal CT or MR may be obtained. 4. Atrophic pancreas. 1.5 cm cystic lesion in the tail of the pancreas, for which nonemergent follow-up CT or MRI pancreas protocol is recommended. Electronically Signed   By: Jasmine PangKim  Fujinaga M.D.   On: 12/28/2015 16:48   Dg Hip Unilat W Or Wo Pelvis 2-3 Views Left  Result Date: 12/28/2015 CLINICAL DATA:  The patient slipped in a bathroom 3 days ago resulting in a fall and left hip pain. Initial encounter. EXAM: DG HIP (WITH OR WITHOUT PELVIS) 2-3V LEFT COMPARISON:  None. FINDINGS: Bilateral total hip replacements are in place. Hardware is intact. No acute abnormality is identified. IMPRESSION: No acute abnormality. Status post bilateral total hip replacement. Electronically Signed   By: Drusilla Kannerhomas  Dalessio M.D.   On: 12/28/2015 13:22   Dg Femur Min 2 Views Right  Result Date:  12/28/2015 CLINICAL DATA:  Right hip and upper leg pain since a fall in bathroom 3 days ago. Initial encounter. EXAM: RIGHT FEMUR 2 VIEWS COMPARISON:  None. FINDINGS: No acute bony or joint abnormality is identified. Right  total hip and knee replacements are in place. Hardware is intact. IMPRESSION: No acute abnormality. Electronically Signed   By: Drusilla Kannerhomas  Dalessio M.D.   On: 12/28/2015 13:24   [place] Abdomen Limited Ruq  Result Date: 12/29/2015 CLINICAL DATA:  Two week history of abdominal pain EXAM: US ABDOMEN LIMITED - RIGHT UPPER QUADRANT COMPARISON:  CT abdomen and pelvis December 28, 2015 FINDINGS: Gallbladder: Surgically absent. Common bile duct: Diameter: Dilated, measuring between 1.4 and 1.7 cm. No mass or calculus seen by ultrasound in the biliary ductal system. Liver: No focal lesion identified. The liver has a somewhat lobular appearance with diffuse increased echogenicity. IMPRESSION: Biliary duct dilatation without mass or calculus evident. This finding may warrant MRCP for further assessment given the degree of biliary duct dilatation evident. Gallbladder absent. Increased liver echogenicity is somewhat lobular appearance. This appearance is consistent with hepatic steatosis with possible underlying disease such as cirrhosis. While no focal liver lesions are evident, it must be cautioned that the sensitivity of ultrasound for detection of focal liver lesions is diminished significantly in this circumstance. Electronically Signed   By: Bretta BangWilliam  Woodruff III M.D.   On: 12/29/2015 09:27    ASSESSMENT AND PLAN:   Active Problems:   Elevated LFTs  1. Acute  Rhabdomyolysis due to fall and crawling with laying in the kitchen and living area for 3 days -IV fluids -Monitor I's and O's and creatinine  recheck CK tomorrow. PT eval.  2. Hypokalemia -Replete with IV fluids  3. Elevated LFT with dilated CBD seen on CT of the abdomen along with right upper quadrant abdominal pain -Patient has mild elevated lipase. -pt has h/o drinking ETOH. Last drink 2 weeks ago. - same findings with Hepatic steatosis on US of the abdomen -called GI consult for further work up regarding r/o biliary obstruction.  4.DM-2   SSI  -hold metformin due to acidosis  5. Clinical dehydration with acidosis which appears due to  Starvation (not eatin for 3 days) -monitor AG  6.DVT prophylaxis -SQ lovenox    All the records are reviewed and case discussed with Care Management/Social Workerr. Management plans discussed with the patient, family and they are in agreement.  CODE STATUS: full  TOTAL TIME TAKING CARE OF THIS PATIENT: 35 minutes.    POSSIBLE D/C IN 1-2 DAYS, DEPENDING ON CLINICAL CONDITION.   Altamese DillingVACHHANI, Kingsley Herandez M.D on 12/29/2015   Between 7am to 6pm - Pager - [phone]  After 6pm go to [email or web address] - Social research officer, governmentpassword EPAS [facility]  [facility]  Office  [phone]  CC: Primary care physician; No PCP Per Patient  Note: This dictation was prepared with Dragon dictation along with smaller phrase technology. Any transcriptional errors that result from this process are unintentional.

## 2015-12-29 NOTE — Consult Note (Signed)
Wyline Mood MD  [address] [place], [place] [address] Phone: [phone] Fax : [phone]  Consultation  Referring Provider:     No ref. provider found Primary Care Physician:  No PCP Per Patient Primary Gastroenterologist:  Dr. Tobi Bastos         Reason for Consultation:     Abnormal ultrasound  Date of Admission:  12/28/2015 Date of Consultation:  12/29/2015         HPI:   Shelby Stewart is a 65 y.o. female was admitted on 12/28/15 after a fall and on the floor for a few days. On admission noted to have rhabdomyolysis . She was found to have elevated LFT's , RUQ pain , a RUQ USG was performed and found to have dilated common bile duct and hence we are consulted.   She says that she has had non radiating , continuous epigastric pain for a few months , that has not progressed, no better with a bowel movement , no worse with food intake, She used to drink 1-2 drinks after work for 40 years which she stopped 1 year back. She denies smoking . No family history or personal history of pancreatitis or pancreatic cancer. Denies any weight loss.     Past Medical History:  Diagnosis Date  . Asthma   . COPD (chronic obstructive pulmonary disease) (HCC)   . Diabetes mellitus without complication (HCC)   . Hypertension     Past Surgical History:  Procedure Laterality Date  . REPLACEMENT TOTAL KNEE Left   . TOTAL HIP ARTHROPLASTY Left   . TOTAL HIP ARTHROPLASTY Left   . TOTAL KNEE ARTHROPLASTY Right     Prior to Admission medications   Medication Sig Start Date End Date Taking? Authorizing Provider  hydrochlorothiazide (HYDRODIURIL) 25 MG tablet Take 12.5 mg by mouth daily.   Yes Historical Provider, MD  lisinopril (PRINIVIL,ZESTRIL) 10 MG tablet Take 10 mg by mouth daily.   Yes Historical Provider, MD  metFORMIN (GLUCOPHAGE) 500 MG tablet Take 500 mg by mouth 2 (two) times daily.   Yes Historical Provider, MD  traZODone (DESYREL) 50 MG tablet Take 100 mg by mouth at bedtime.   Yes  Historical Provider, MD  venlafaxine (EFFEXOR) 75 MG tablet Take 300 mg by mouth daily. Take 4 caps (300mg ) daily   Yes Historical Provider, MD    Family History  Problem Relation Age of Onset  . Heart attack Father      Social History  Substance Use Topics  . Smoking status: Never Smoker  . Smokeless tobacco: Never Used  . Alcohol use No    Allergies as of 12/28/2015 - Review Complete 12/28/2015  Allergen Reaction Noted  . Penicillins Shortness Of Breath 12/28/2015  . Amoxicillin Rash 12/28/2015  . Ciprofloxacin Rash 12/28/2015    Review of Systems:    All systems reviewed and negative except where noted in HPI.   Physical Exam:  Vital signs in last 24 hours: Temp:  [97.9 F (36.6 C)-98.4 F (36.9 C)] 97.9 F (36.6 C) (12/12 0744) Pulse Rate:  [87-113] 87 (12/12 0744) Resp:  [16-18] 16 (12/12 0744) BP: (106-152)/(60-92) 108/60 (12/12 0744) SpO2:  [97 %-100 %] 98 % (12/12 0744) Weight:  [212 lb 11.2 oz (96.5 kg)] 212 lb 11.2 oz (96.5 kg) (12/11 1933) Last BM Date: 12/26/15 General:   Pleasant, cooperative in NAD Head:  Normocephalic and atraumatic. Eyes:   No icterus.   Conjunctiva pink. PERRLA. Ears:  Normal auditory acuity. Neck:  Supple; no masses  or thyroidomegaly Lungs: Respirations even and unlabored. Lungs clear to auscultation bilaterally.   No wheezes, crackles, or rhonchi.  Heart:  Regular rate and rhythm;  Without murmur, clicks, rubs or gallops Abdomen:  Soft, nondistended,mild epigatric tenderness. Normal bowel sounds. No appreciable masses or hepatomegaly.  No rebound or guarding.  Rectal:  Not performed. Neurologic:  Alert and oriented x3;  grossly normal neurologically. Skin:  Intact without significant lesions or rashes. Cervical Nodes:  No significant cervical adenopathy. Psych:  Alert and cooperative. Normal affect.  LAB RESULTS:  Recent Labs  12/28/15 1407  WBC 9.7  HGB 15.3  HCT 44.5  PLT 138*   BMET  Recent Labs  12/28/15 1407  12/29/15 0602  NA 135 134*  K 3.3* 3.4*  CL 94* 99*  CO2 19* 22  GLUCOSE 228* 179*  BUN 24* 17  CREATININE 0.89 0.75  CALCIUM 9.0 8.0*   LFT  Recent Labs  12/29/15 0602  PROT 5.6*  ALBUMIN 3.1*  AST 40  ALT 21  ALKPHOS 73  BILITOT 2.4*   PT/INR No results for input(s): LABPROT, INR in the last 72 hours.  STUDIES: Dg Chest 1 View  Result Date: 12/28/2015 CLINICAL DATA:  Fall and generalized weakness. EXAM: CHEST 1 VIEW COMPARISON:  None. FINDINGS: The heart size and mediastinal contours are within normal limits. Lungs show bibasilar atelectasis. There is no evidence of pulmonary edema, consolidation, pneumothorax, nodule or pleural fluid. Healed bilateral rib fractures evident. IMPRESSION: No acute findings.  Bibasilar atelectasis. Electronically Signed   By: Irish LackGlenn  Yamagata M.D.   On: 12/28/2015 13:22   Ct Abdomen Pelvis W Contrast  Result Date: 12/28/2015 CLINICAL DATA:  Fall with weakness, nausea and vomiting EXAM: CT ABDOMEN AND PELVIS WITH CONTRAST TECHNIQUE: Multidetector CT imaging of the abdomen and pelvis was performed using the standard protocol following bolus administration of intravenous contrast. CONTRAST:  100mL ISOVUE-300 IOPAMIDOL (ISOVUE-300) INJECTION 61% COMPARISON:  Radiographs 12/28/2015 FINDINGS: Lower chest: Visualized lung bases demonstrate no acute consolidation or pleural effusion. Hepatobiliary: There is diffuse decreased density of the hepatic parenchyma, consistent with fatty infiltration. No focal hepatic abnormality. There is somewhat small appearance of the right hepatic lobe, correlate with surgical history. Irregular enhancement within the right hepatic lobe this felt to relate to unusual appearance of vascular enhancement, likely hepatic venous. There is no focal hepatic abnormality. Dilated extrahepatic common bile duct, measuring up to 2 cm in diameter. A well-formed gallbladder is not clearly identified. There is slight prominence of the  pancreatic duct at the head of the pancreas. No calcified stones. Pancreas: Atrophic. Prominent pancreatic duct. There is a 1.5 cm cystic appearing lesion in the tail of the pancreas, series 2, image number 22. No peripancreatic inflammation. Spleen: Normal in size without focal abnormality. Adrenals/Urinary Tract: Left adrenal gland is within normal limits. 2.6 cm right adrenal gland mass without demonstrable fatty density. Bilateral kidneys are within normal limits. Bladder is largely obscured by streak artifact from hip replacement. Stomach/Bowel: Stomach is nonenlarged. No dilated bowel. 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 Appendix is not well visualized. Vascular/Lymphatic: Tortuous pelvic and gonadal vessels. Atherosclerosis of the aorta. No abnormally enlarged lymph nodes. Reproductive: Uterus and bilateral adnexa are unremarkable. Other: No free air or free fluid Musculoskeletal: Bilateral hip replacements. Deformity of L2 vertebral body with osseous bridging anteriorly at L1 and L2, possibly related to old trauma or congenital malformation. IMPRESSION: 1. Fatty infiltration of the liver. 2. Markedly enlargement of extrahepatic common bowel duct up  to 2 cm with slight prominence of pancreatic duct. Recommend correlation with laboratory values to evaluate for obstruction. Further evaluation with MR or ERCP may be obtained as clinically indicated 3. 2.6 cm right adrenal gland mass. Nonemergent evaluation with adrenal CT or MR may be obtained. 4. Atrophic pancreas. 1.5 cm cystic lesion in the tail of the pancreas, for which nonemergent follow-up CT or MRI pancreas protocol is recommended. Electronically Signed   By: Jasmine PangKim  Fujinaga M.D.   On: 12/28/2015 16:48   Dg Hip Unilat W Or Wo Pelvis 2-3 Views Left  Result Date: 12/28/2015 CLINICAL DATA:  The patient slipped in a bathroom 3 days ago resulting in a fall and left hip pain. Initial encounter. EXAM: DG HIP (WITH OR WITHOUT PELVIS)  2-3V LEFT COMPARISON:  None. FINDINGS: Bilateral total hip replacements are in place. Hardware is intact. No acute abnormality is identified. IMPRESSION: No acute abnormality. Status post bilateral total hip replacement. Electronically Signed   By: Drusilla Kannerhomas  Dalessio M.D.   On: 12/28/2015 13:22   Dg Femur Min 2 Views Right  Result Date: 12/28/2015 CLINICAL DATA:  Right hip and upper leg pain since a fall in bathroom 3 days ago. Initial encounter. EXAM: RIGHT FEMUR 2 VIEWS COMPARISON:  None. FINDINGS: No acute bony or joint abnormality is identified. Right total hip and knee replacements are in place. Hardware is intact. IMPRESSION: No acute abnormality. Electronically Signed   By: Drusilla Kannerhomas  Dalessio M.D.   On: 12/28/2015 13:24   [place] Abdomen Limited Ruq  Result Date: 12/29/2015 CLINICAL DATA:  Two week history of abdominal pain EXAM: US ABDOMEN LIMITED - RIGHT UPPER QUADRANT COMPARISON:  CT abdomen and pelvis December 28, 2015 FINDINGS: Gallbladder: Surgically absent. Common bile duct: Diameter: Dilated, measuring between 1.4 and 1.7 cm. No mass or calculus seen by ultrasound in the biliary ductal system. Liver: No focal lesion identified. The liver has a somewhat lobular appearance with diffuse increased echogenicity. IMPRESSION: Biliary duct dilatation without mass or calculus evident. This finding may warrant MRCP for further assessment given the degree of biliary duct dilatation evident. Gallbladder absent. Increased liver echogenicity is somewhat lobular appearance. This appearance is consistent with hepatic steatosis with possible underlying disease such as cirrhosis. While no focal liver lesions are evident, it must be cautioned that the sensitivity of ultrasound for detection of focal liver lesions is diminished significantly in this circumstance. Electronically Signed   By: Bretta BangWilliam  Woodruff III M.D.   On: 12/29/2015 09:27      Impression / Plan:   Shelby Stewart is a [age] y.o. y/o female with a  recent history of fall, rhabdomyolysis,  Dehydration , nausea vomiting  , found on CT scan to have a CBD of 2 cm , mild prominence of pancreatic duct , 2.6 cm adrenal mass , atrophic pancreas, 1.5 cm cystic lesion in the tail of the pancreas. Gall bladder is absent noted on RUQ USG, possible fatty liver vs cirrhosis . She has a history of alcohol abuse.   Plan   1. Suggest IV vitamins if not already given  2. IV fluids  3. Will check fractionated bilirubin  4. MRCP and MRI pancreatic mass protocol to evaluate distal CBD as well as pancreatic tail 5. Has an adrenal mass which would need follow up as well  6. Continue d/c alcohol  Thank you for involving me in the care of this patient.      LOS: 1 day   Wyline MoodKiran Jasim Harari, MD  12/29/2015, 1:21 PM

## 2015-12-29 NOTE — Progress Notes (Signed)
Inpatient Diabetes Program Recommendations  AACE/ADA: New Consensus Statement on Inpatient Glycemic Control (2015)  Target Ranges:  Prepandial:   less than 140 mg/dL      Peak postprandial:   less than 180 mg/dL (1-2 hours)      Critically ill patients:  140 - 180 mg/dL  Results for [name], Shelby (MRN [id number]) as of 12/29/2015 12:02  Ref. Range 12/28/2015 20:47 12/29/2015 11:28  Glucose-Capillary Latest Ref Range: 65 - 99 mg/dL 696237 (H) 295213 (H)   Results for [name], Shelby (MRN [id number]) as of 12/29/2015 12:02  Ref. Range 12/28/2015 14:07 12/29/2015 06:02  Glucose Latest Ref Range: 65 - 99 mg/dL 102228 (H) 725179 (H)    Review of Glycemic Control  Diabetes history: DM2 Outpatient Diabetes medications: Metformin 500 mg BID Current orders for Inpatient glycemic control: Novolog 0-9 units TID with meals  Inpatient Diabetes Program Recommendations: Correction (SSI): Please consider increasing Novolog correction to Moderate scale and adding Novolog bedtime correction scale. HgbA1C: Please consider ordering an A1C to evaluate glycemic control over the past 2-3 months.  Thanks, Orlando PennerMarie Sylus Stgermain, RN, MSN, CDE Diabetes Coordinator Inpatient Diabetes Program [phone] (Team Pager from 8am to 5pm)

## 2015-12-29 NOTE — Progress Notes (Signed)
Shift assessment completed at 0815. Pt alert and oriented, in no distress. Pt c/o some pain to her back, stated she does not normally have back pain, usually its her legs she has trouble with. Pt stated she feel in her home and was unable to get up x3 days. Tripped coming out of her bathroom. Skin is warm and dry, pt is on room air, lungs are clear bilat. HR is regular, abdomen is soft, bs heard. Pt stating she had diarrhea at home. Pt si wearing incontinence brief, ppp, no edema noted. Pt has scd's on bilat.Marland Kitchen. PIV #20 intact to Rac with iv ns infusing at 6875mls/hr, site is free of redness and swelling. Pt stated she did not feel sore or stiff at this time. Since assessment, pt has left the floor to complete abdominal ultrasound and has returned, consuming clear liquids without difficulty. Srx2, call bell in reach.

## 2015-12-30 ENCOUNTER — Inpatient Hospital Stay: Payer: Medicare Other

## 2015-12-30 LAB — COMPREHENSIVE METABOLIC PANEL
ALK PHOS: 72 U/L (ref 38–126)
ALT: 17 U/L (ref 14–54)
ANION GAP: 6 (ref 5–15)
AST: 27 U/L (ref 15–41)
Albumin: 2.8 g/dL — ABNORMAL LOW (ref 3.5–5.0)
BUN: 14 mg/dL (ref 6–20)
CALCIUM: 8.1 mg/dL — AB (ref 8.9–10.3)
CO2: 27 mmol/L (ref 22–32)
CREATININE: 0.54 mg/dL (ref 0.44–1.00)
Chloride: 103 mmol/L (ref 101–111)
Glucose, Bld: 172 mg/dL — ABNORMAL HIGH (ref 65–99)
Potassium: 3.5 mmol/L (ref 3.5–5.1)
SODIUM: 136 mmol/L (ref 135–145)
TOTAL PROTEIN: 5.3 g/dL — AB (ref 6.5–8.1)
Total Bilirubin: 1.7 mg/dL — ABNORMAL HIGH (ref 0.3–1.2)

## 2015-12-30 LAB — GLUCOSE, CAPILLARY
GLUCOSE-CAPILLARY: 165 mg/dL — AB (ref 65–99)
Glucose-Capillary: 173 mg/dL — ABNORMAL HIGH (ref 65–99)
Glucose-Capillary: 243 mg/dL — ABNORMAL HIGH (ref 65–99)

## 2015-12-30 LAB — URINE CULTURE: Culture: >=100000 — AB

## 2015-12-30 LAB — LIPASE, BLOOD: LIPASE: 147 U/L — AB (ref 11–51)

## 2015-12-30 LAB — MAGNESIUM: MAGNESIUM: 1.7 mg/dL (ref 1.7–2.4)

## 2015-12-30 LAB — CK: Total CK: 181 U/L (ref 38–234)

## 2015-12-30 MED ORDER — METFORMIN HCL 500 MG PO TABS
500.0000 mg | ORAL_TABLET | Freq: Two times a day (BID) | ORAL | Status: DC
  Administered 2015-12-30: 500 mg via ORAL
  Filled 2015-12-30: qty 1

## 2015-12-30 MED ORDER — TRAZODONE HCL 100 MG PO TABS: 100.0000 mg | ORAL_TABLET | Freq: Every day | ORAL | Status: DC

## 2015-12-30 MED ORDER — METFORMIN HCL 500 MG PO TABS: 500.0000 mg | ORAL_TABLET | Freq: Two times a day (BID) | ORAL | Status: DC

## 2015-12-30 MED ORDER — MAGNESIUM OXIDE 400 (241.3 MG) MG PO TABS
400.0000 mg | ORAL_TABLET | Freq: Two times a day (BID) | ORAL | Status: DC
  Administered 2015-12-30: 400 mg via ORAL
  Filled 2015-12-30: qty 1

## 2015-12-30 MED ORDER — HYDROCHLOROTHIAZIDE 25 MG PO TABS
12.5000 mg | ORAL_TABLET | Freq: Every day | ORAL | Status: DC
  Filled 2015-12-30: qty 1

## 2015-12-30 MED ORDER — TRAZODONE HCL 50 MG PO TABS: 100.0000 mg | ORAL_TABLET | Freq: Every day | ORAL | 0 refills | Status: AC

## 2015-12-30 MED ORDER — SULFAMETHOXAZOLE-TRIMETHOPRIM 800-160 MG PO TABS: 1.0000 | ORAL_TABLET | Freq: Two times a day (BID) | ORAL | 0 refills | Status: DC

## 2015-12-30 MED ORDER — GADOBENATE DIMEGLUMINE 529 MG/ML IV SOLN
20.0000 mL | Freq: Once | INTRAVENOUS | Status: AC | PRN
  Administered 2015-12-30: 20 mL via INTRAVENOUS

## 2015-12-30 MED ORDER — TRAMADOL HCL 50 MG PO TABS: 50.0000 mg | ORAL_TABLET | Freq: Two times a day (BID) | ORAL | 0 refills | Status: DC | PRN

## 2015-12-30 NOTE — Discharge Summary (Signed)
[facility] - [place] at [facility]   PATIENT NAME: Shelby Stewart    MR#:  [id number]  DATE OF BIRTH:  [birth year]  DATE OF ADMISSION:  12/28/2015 ADMITTING PHYSICIAN: Enedina FinnerSona Cruz Devilla, MD  DATE OF DISCHARGE: 12/30/15  PRIMARY CARE PHYSICIAN: No PCP Per Patient    ADMISSION DIAGNOSIS:  Generalized weakness [R53.1] Abdominal pain [R10.9] Fall in home, initial encounter [W19.XXXA, Y92.099] Non-traumatic rhabdomyolysis [M62.82] Acute pancreatitis, unspecified complication status, unspecified pancreatitis type [K85.90]  DISCHARGE DIAGNOSIS:  Acute Rhabdomyolysis Hepatic steatosis/early cirrhossis changes and dilated CBD on MRCP---->out pt GI f/u HTN DM-2  SECONDARY DIAGNOSIS:   Past Medical History:  Diagnosis Date  . Asthma   . COPD (chronic obstructive pulmonary disease) (HCC)   . Diabetes mellitus without complication (HCC)   . Hypertension     HOSPITAL COURSE:   Shelby Stewart  is a 65 y.o. female with a known history of Diabetes, hypertension, COPD, history of alcohol use last drink 2 weeks ago comes to the emergency room after she had a fall 3 days ago in the bathroom. [place] she sold her house into an apartment and had a fall without any head injury  1. Acute Rhabdomyolysis due to fall and crawling with laying in the kitchen and living area for 3 days -received IV fluids -Monitor I's and O's and creatinine  -. PT eval-HHPT -MUCH BETTER  2. Hypokalemia -RepleteD   3. Elevated LFT with dilated CBD seen on CT of the abdomen along with right upper quadrant abdominal pain -Patient has mild elevated lipase. -pt has h/o drinking ETOH. Last drink 2 weeks ago. - same findings with Hepatic steatosis on US of the abdomen -mrcp RESULTS NOTED. dR [name] TO WORK UP AS OUT PT eus VS ercp. APPT MADE FOR jAN 2ND  4.DM-2  SSI  -resume metformin   5. Clinical dehydration with acidosis which appears due to Starvation  (not eatin for 3 days) -monitor AG -resolved  6.DVT prophylaxis -SQ lovenox  7. UTI on bactrim  D/c home CONSULTS OBTAINED:  Treatment Team:  Wyline MoodKiran [name], MD  DRUG ALLERGIES:   Allergies  Allergen Reactions  . Penicillins Shortness Of Breath  . Amoxicillin Rash  . Ciprofloxacin Rash    DISCHARGE MEDICATIONS:   Current Discharge Medication List    START taking these medications   Details  sulfamethoxazole-trimethoprim (BACTRIM DS,SEPTRA DS) 800-160 MG tablet Take 1 tablet by mouth every 12 (twelve) hours. Qty: 10 tablet, Refills: 0    traMADol (ULTRAM) 50 MG tablet Take 1 tablet (50 mg total) by mouth every 12 (twelve) hours as needed for moderate pain. Qty: 20 tablet, Refills: 0      CONTINUE these medications which have CHANGED   Details  traZODone (DESYREL) 50 MG tablet Take 2 tablets (100 mg total) by mouth at bedtime. Qty: 20 tablet, Refills: 0      CONTINUE these medications which have NOT CHANGED   Details  hydrochlorothiazide (HYDRODIURIL) 25 MG tablet Take 12.5 mg by mouth daily.    lisinopril (PRINIVIL,ZESTRIL) 10 MG tablet Take 10 mg by mouth daily.    metFORMIN (GLUCOPHAGE) 500 MG tablet Take 500 mg by mouth 2 (two) times daily.    venlafaxine (EFFEXOR) 75 MG tablet Take 300 mg by mouth daily. Take 4 caps (300mg ) daily        If you experience worsening of your admission symptoms, develop shortness of breath, life threatening emergency, suicidal or homicidal thoughts you must seek medical  attention immediately by calling 911 or calling your MD immediately  if symptoms less severe.  You Must read complete instructions/literature along with all the possible adverse reactions/side effects for all the Medicines you take and that have been prescribed to you. Take any new Medicines after you have completely understood and accept all the possible adverse reactions/side effects.   Please note  You were cared for by a hospitalist during your hospital  stay. If you have any questions about your discharge medications or the care you received while you were in the hospital after you are discharged, you can call the unit and asked to speak with the hospitalist on call if the hospitalist that took care of you is not available. Once you are discharged, your primary care physician will handle any further medical issues. Please note that NO REFILLS for any discharge medications will be authorized once you are discharged, as it is imperative that you return to your primary care physician (or establish a relationship with a primary care physician if you do not have one) for your aftercare needs so that they can reassess your need for medications and monitor your lab values. Today   SUBJECTIVE   Feels ok  VITAL SIGNS:  Blood pressure (!) 116/57, pulse 81, temperature 98.1 F (36.7 C), temperature source Oral, resp. rate 18, height 5' (1.524 m), weight 96.5 kg (212 lb 11.2 oz), SpO2 97 %.  I/O:    Intake/Output Summary (Last 24 hours) at 12/30/15 1543 Last data filed at 12/30/15 0500  Gross per 24 hour  Intake              627 ml  Output                0 ml  Net              627 ml    PHYSICAL EXAMINATION:  GENERAL:  65 y.o.-year-old patient lying in the bed with no acute distress.  EYES: Pupils equal, round, reactive to light and accommodation. No scleral icterus. Extraocular muscles intact.  HEENT: Head atraumatic, normocephalic. Oropharynx and nasopharynx clear.  NECK:  Supple, no jugular venous distention. No thyroid enlargement, no tenderness.  LUNGS: Normal breath sounds bilaterally, no wheezing, rales,rhonchi or crepitation. No use of accessory muscles of respiration.  CARDIOVASCULAR: S1, S2 normal. No murmurs, rubs, or gallops.  ABDOMEN: Soft, non-tender, non-distended. Bowel sounds present. No organomegaly or mass.  EXTREMITIES: No pedal edema, cyanosis, or clubbing.  NEUROLOGIC: Cranial nerves II through XII are intact. Muscle  strength 5/5 in all extremities. Sensation intact. Gait not checked.  PSYCHIATRIC: The patient is alert and oriented x 3.  SKIN: No obvious rash, lesion, or ulcer.   DATA REVIEW:   CBC   Recent Labs Lab 12/28/15 1407  WBC 9.7  HGB 15.3  HCT 44.5  PLT 138*    Chemistries   Recent Labs Lab 12/30/15 0534  NA 136  K 3.5  CL 103  CO2 27  GLUCOSE 172*  BUN 14  CREATININE 0.54  CALCIUM 8.1*  MG 1.7  AST 27  ALT 17  ALKPHOS 72  BILITOT 1.7*    Microbiology Results   Recent Results (from the past 240 hour(s))  Urine culture     Status: Abnormal   Collection Time: 12/28/15  2:33 PM  Result Value Ref Range Status   Specimen Description URINE, RANDOM  Final   Special Requests NONE  Final   Culture >=100,000 COLONIES/mL ENTEROBACTER SPECIES (A)  Final   Report Status 12/30/2015 FINAL  Final   Organism ID, Bacteria ENTEROBACTER SPECIES (A)  Final      Susceptibility   Enterobacter species - MIC*    CEFAZOLIN >=64 RESISTANT Resistant     CEFTRIAXONE <=1 SENSITIVE Sensitive     CIPROFLOXACIN <=0.25 SENSITIVE Sensitive     GENTAMICIN <=1 SENSITIVE Sensitive     IMIPENEM 2 SENSITIVE Sensitive     NITROFURANTOIN 32 SENSITIVE Sensitive     TRIMETH/SULFA <=20 SENSITIVE Sensitive     PIP/TAZO <=4 SENSITIVE Sensitive     * >=100,000 COLONIES/mL ENTEROBACTER SPECIES    RADIOLOGY:  Ct Abdomen Pelvis W Contrast  Result Date: 12/28/2015 CLINICAL DATA:  Fall with weakness, nausea and vomiting EXAM: CT ABDOMEN AND PELVIS WITH CONTRAST TECHNIQUE: Multidetector CT imaging of the abdomen and pelvis was performed using the standard protocol following bolus administration of intravenous contrast. CONTRAST:  100mL ISOVUE-300 IOPAMIDOL (ISOVUE-300) INJECTION 61% COMPARISON:  Radiographs 12/28/2015 FINDINGS: Lower chest: Visualized lung bases demonstrate no acute consolidation or pleural effusion. Hepatobiliary: There is diffuse decreased density of the hepatic parenchyma, consistent with  fatty infiltration. No focal hepatic abnormality. There is somewhat small appearance of the right hepatic lobe, correlate with surgical history. Irregular enhancement within the right hepatic lobe this felt to relate to unusual appearance of vascular enhancement, likely hepatic venous. There is no focal hepatic abnormality. Dilated extrahepatic common bile duct, measuring up to 2 cm in diameter. A well-formed gallbladder is not clearly identified. There is slight prominence of the pancreatic duct at the head of the pancreas. No calcified stones. Pancreas: Atrophic. Prominent pancreatic duct. There is a 1.5 cm cystic appearing lesion in the tail of the pancreas, series 2, image number 22. No peripancreatic inflammation. Spleen: Normal in size without focal abnormality. Adrenals/Urinary Tract: Left adrenal gland is within normal limits. 2.6 cm right adrenal gland mass without demonstrable fatty density. Bilateral kidneys are within normal limits. Bladder is largely obscured by streak artifact from hip replacement. Stomach/Bowel: Stomach is nonenlarged. No dilated bowel. 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 Appendix is not well visualized. Vascular/Lymphatic: Tortuous pelvic and gonadal vessels. Atherosclerosis of the aorta. No abnormally enlarged lymph nodes. Reproductive: Uterus and bilateral adnexa are unremarkable. Other: No free air or free fluid Musculoskeletal: Bilateral hip replacements. Deformity of L2 vertebral body with osseous bridging anteriorly at L1 and L2, possibly related to old trauma or congenital malformation. IMPRESSION: 1. Fatty infiltration of the liver. 2. Markedly enlargement of extrahepatic common bowel duct up to 2 cm with slight prominence of pancreatic duct. Recommend correlation with laboratory values to evaluate for obstruction. Further evaluation with MR or ERCP may be obtained as clinically indicated 3. 2.6 cm right adrenal gland mass. Nonemergent evaluation  with adrenal CT or MR may be obtained. 4. Atrophic pancreas. 1.5 cm cystic lesion in the tail of the pancreas, for which nonemergent follow-up CT or MRI pancreas protocol is recommended. Electronically Signed   By: Jasmine PangKim  Fujinaga M.D.   On: 12/28/2015 16:48   Mr Abdomen Mrcp Vivien RossettiW Wo Contast  Result Date: 12/30/2015 CLINICAL DATA:  Biliary duct dilatation. Hepatic steatosis and possible cirrhosis. EXAM: MRI ABDOMEN WITHOUT AND WITH CONTRAST (INCLUDING MRCP) TECHNIQUE: Multiplanar multisequence MR imaging of the abdomen was performed both before and after the administration of intravenous contrast. Heavily T2-weighted images of the biliary and pancreatic ducts were obtained, and three-dimensional MRCP images were rendered by post processing. CONTRAST:  20mL MULTIHANCE GADOBENATE DIMEGLUMINE 529 MG/ML IV SOLN  COMPARISON:  Multiple exams, including 12/29/2015 and 12/28/2015 FINDINGS: Despite efforts by the technologist and patient, motion artifact is present on today's exam and could not be eliminated. This reduces exam sensitivity and specificity. Lower chest: Unremarkable Hepatobiliary: Hepatic cirrhosis.  Mild diffuse hepatic steatosis. There is only minimal intrahepatic biliary dilatation particularly in the left hepatic lobe. Common hepatic duct 1.5 cm. Common bile duct 1.5 cm. The CBD demonstrates very minimally blunted distal termination at the ampulla without a visible filling defect. I do not perceive an abnormal enhancing lesion along the biliary tree or ampulla, with the understanding that the motion artifact is fairly severe in this reduces diagnostic sensitivity and specificity. No abnormal enhancing liver mass seen. Pancreas: There several small clustered cystic lesions along the tail the pancreas, the largest is 1.4 cm in diameter but several others are in the 3-5 mm range. I do not see any dilatation of the dorsal pancreatic duct in connectivity of the small cystic lesions to the dorsal pancreatic duct  is not established. No abnormal enhancement along these lesions or elsewhere in the pancreas is perceived, again with the understanding that sensitivity is reduced due to motion artifact. Spleen:  Unremarkable Adrenals/Urinary Tract:  Unremarkable Stomach/Bowel: Small periampullary duodenal diverticulum. Vascular/Lymphatic:  Unremarkable Other:  No supplemental non-categorized findings. Musculoskeletal: Unremarkable IMPRESSION: 1. The extrahepatic biliary tree measures up to 1.5 cm in diameter, without a definite filling defect and with slightly blunted tapering in the ampulla. I do not see a definite ampullary mass or other cause for this dilatation, some of which may simply be due to physiologic response to cholecystectomy. Please note that sensitivity for small lesions is reduced due to the severity of motion artifact. A small periampullary duodenal diverticulum is present. 2. Hepatic cirrhosis without visible mass. 3. Several small cystic lesions along the tail the pancreas are probably postinflammatory. The largest measures 1.4 cm in diameter. No definite connectivity to the biliary tree. The possibility of a small mucinous tumor like IPMN is not totally excluded. Based on the patient's age of 26, current guidelines call for reimaging every 2 years to assess such lesions. Accordingly, pancreatic protocol CT or MRI is recommended in 2 years time in order to reassess. This recommendation follows ACR consensus guidelines: Management of Incidental Pancreatic Cysts: A White Paper of the ACR Incidental Findings Committee. J Am Coll Radiol 2017;14:911-923. Electronically Signed   By: Gaylyn Rong M.D.   On: 12/30/2015 13:18   US Abdomen Limited Ruq  Result Date: 12/29/2015 CLINICAL DATA:  Two week history of abdominal pain EXAM: US ABDOMEN LIMITED - RIGHT UPPER QUADRANT COMPARISON:  CT abdomen and pelvis December 28, 2015 FINDINGS: Gallbladder: Surgically absent. Common bile duct: Diameter: Dilated,  measuring between 1.4 and 1.7 cm. No mass or calculus seen by ultrasound in the biliary ductal system. Liver: No focal lesion identified. The liver has a somewhat lobular appearance with diffuse increased echogenicity. IMPRESSION: Biliary duct dilatation without mass or calculus evident. This finding may warrant MRCP for further assessment given the degree of biliary duct dilatation evident. Gallbladder absent. Increased liver echogenicity is somewhat lobular appearance. This appearance is consistent with hepatic steatosis with possible underlying disease such as cirrhosis. While no focal liver lesions are evident, it must be cautioned that the sensitivity of ultrasound for detection of focal liver lesions is diminished significantly in this circumstance. Electronically Signed   By: Bretta Bang III M.D.   On: 12/29/2015 09:27     Management plans discussed with the patient,  family and they are in agreement.  CODE STATUS:     Code Status Orders        Start     Ordered   12/28/15 1924  Full code  Continuous     12/28/15 1923    Code Status History    Date Active Date Inactive Code Status Order ID Comments User Context   This patient has a current code status but no historical code status.    Advance Directive Documentation   Flowsheet Row Most Recent Value  Type of Advance Directive  Living will  Pre-existing out of facility DNR order (yellow form or pink MOST form)  No data  "MOST" Form in Place?  No data      TOTAL TIME TAKING CARE OF THIS PATIENT:40 minutes.    Greenleigh Kauth M.D on 12/30/2015 at 3:43 PM  Between 7am to 6pm - Pager - [phone] After 6pm go to [email or web address] - password [email or web address] [facility]  [place] [facility]  Office  [phone]  CC: Primary care physician; No PCP Per Patient

## 2015-12-30 NOTE — Care Management (Addendum)
Dr. Enedina FinnerSona Stewart is willing to sign home health orders for PT. Patient has no choice of home health agency. Shelby Stewart with [facility] notified. Patient has a walker, cane and shower seat at home.

## 2015-12-30 NOTE — Discharge Instructions (Signed)
Keep your appt with GI as scheduled  DO not drink ETOH

## 2015-12-30 NOTE — Progress Notes (Signed)
Clinical Child psychotherapistocial Worker (CSW) discussed case with Chief Executive OfficerCSW director and it was agreed that patient will be given a taxi voucher for transport home because PT is recommending home health. RN aware of above. Please reconsult if future social work needs arise. CSW signing off.   [employer] Hughes IncorporatedBailey Quillan Whitter, LCSW [phone]

## 2015-12-30 NOTE — Care Management Note (Addendum)
Case Management Note  Patient Details  Name: Shelby Stewart MRN: [id number] Date of Birth: March 21, 1950  Subjective/Objective:  Met with patient at bedside to discuss discharge planning. She just recently moved here so she does not have a PCP. She lives alone and does not drive but has access to getting to the doctor if needed. Prior to admission she was independent with adls with the use of assistive devices.  Will not qualify for [facility] PT. Patient states she will call and work on getting a PCP at [place].                   Action/Plan:   Expected Discharge Date:  12/30/15               Expected Discharge Plan:  [place]  In-House Referral:     Discharge planning Services  CM Consult  Post Acute Care Choice:  Home Health Choice offered to:  Patient  DME Arranged:    DME Agency:     HH Arranged:   [place] Agency:    Status of Service:  In process, will continue to follow  If discussed at Long Length of Stay Meetings, dates discussed:    Additional Comments:  Jolly Mango, RN 12/30/2015, 2:00 PM

## 2016-01-19 ENCOUNTER — Ambulatory Visit: Payer: Self-pay | Admitting: Gastroenterology

## 2016-01-20 ENCOUNTER — Ambulatory Visit (INDEPENDENT_AMBULATORY_CARE_PROVIDER_SITE_OTHER): Payer: Medicare Other | Admitting: Gastroenterology

## 2016-01-20 ENCOUNTER — Other Ambulatory Visit: Payer: Self-pay | Admitting: *Deleted

## 2016-01-20 ENCOUNTER — Encounter: Payer: Self-pay | Admitting: Gastroenterology

## 2016-01-20 VITALS — BP 135/85 | HR 88 | Temp 98.1°F | Resp 18 | Ht 60.0 in | Wt 219.8 lb

## 2016-01-20 DIAGNOSIS — K838 Other specified diseases of biliary tract: Secondary | ICD-10-CM | POA: Diagnosis not present

## 2016-01-20 DIAGNOSIS — K862 Cyst of pancreas: Secondary | ICD-10-CM | POA: Diagnosis not present

## 2016-01-20 DIAGNOSIS — K7460 Unspecified cirrhosis of liver: Secondary | ICD-10-CM

## 2016-01-20 NOTE — Progress Notes (Signed)
Primary Care Physician: No PCP Per Patient  Primary Gastroenterologist:  Dr. Wyline Mood   Chief Complaint  Patient presents with  . Abnormal MRCP    HPI: Shelby Stewart is a 66 y.o. female here for a hospital follow up . I was consulted while she was recently admitted to the hospital on 12/28/15 after a fall in the bathroom , treated for rabdomyolysis , LFT's were elevated and imaging demonstrated a dilated CBD, last drink of alcohol was 2 weeks prior . H/o heavy drinking 2 drinks daily for 40 years.  12/29/15 Tbil 2.4, AST/ALT normal. The bilirubin was predominantly indirect.   I obtained a MRCP- showed a 1.5 cm extrahepatic biliary tree ,no clear obstruction , severe motion artifact , liver cirrhosis , cystic lesions of the pancreas. Quit alcohol 2 months back. She says in the past she would abstain for 6 months then drink for a while then stop again. Denies any abdominal symptoms . "lot of alcoholism in my family"  Denies any illegal drug use. 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 Denies any blood transfusions.   Current Outpatient Prescriptions  Medication Sig Dispense Refill  . albuterol (PROVENTIL HFA;VENTOLIN HFA) 108 (90 Base) MCG/ACT inhaler Inhale into the lungs.    . budesonide-formoterol (SYMBICORT) 160-4.5 MCG/ACT inhaler Inhale into the lungs.    . Disulfiram 500 MG TABS Take 500 mg by mouth.    . docusate sodium (COLACE) 100 MG capsule Take 100 mg by mouth.    . folic acid (FOLVITE) 800 MCG tablet Take by mouth.    . gabapentin (NEURONTIN) 300 MG capsule Take 300 mg by mouth.    . hydrochlorothiazide (HYDRODIURIL) 25 MG tablet Take 12.5 mg by mouth daily.    Marland Kitchen lisinopril (PRINIVIL,ZESTRIL) 10 MG tablet Take 10 mg by mouth daily.    . Melatonin 3 MG TABS Take 3 mg by mouth.    . metFORMIN (GLUCOPHAGE) 500 MG tablet Take 500 mg by mouth 2 (two) times daily.    . simvastatin (ZOCOR) 40 MG tablet Take 40 mg by mouth.    . Spacer/Aero-Holding Chambers  (AEROCHAMBER MINI CHAMBER) DEVI Inhale into the lungs.    . sulfamethoxazole-trimethoprim (BACTRIM DS,SEPTRA DS) 800-160 MG tablet Take 1 tablet by mouth every 12 (twelve) hours. 10 tablet 0  . traMADol (ULTRAM) 50 MG tablet Take 1 tablet (50 mg total) by mouth every 12 (twelve) hours as needed for moderate pain. 20 tablet 0  . traZODone (DESYREL) 50 MG tablet Take 2 tablets (100 mg total) by mouth at bedtime. 20 tablet 0  . venlafaxine (EFFEXOR) 75 MG tablet Take 300 mg by mouth daily. Take 4 caps (300mg ) daily    . Vitamin D, Ergocalciferol, (DRISDOL) 50000 units CAPS capsule Take by mouth.    . zinc sulfate 220 (50 Zn) MG capsule Take 220 mg by mouth.     No current facility-administered medications for this visit.     Allergies as of 01/20/2016 - Review Complete 01/20/2016  Allergen Reaction Noted  . Penicillins Shortness Of Breath and Other (See Comments) 01/29/2013  . Doxycycline calcium Other (See Comments) 01/29/2013  . Other  01/29/2013  . Sulfa antibiotics  01/29/2013  . Vancomycin Other (See Comments) 01/29/2013  . Amoxicillin Rash 12/28/2015  . Ciprofloxacin Rash and Other (See Comments) 01/29/2013  . Clindamycin Itching 01/29/2013    ROS:  General: Negative for anorexia, weight loss, fever, chills, fatigue, weakness. ENT: Negative for hoarseness, difficulty swallowing , nasal congestion. CV: Negative  for chest pain, angina, palpitations, dyspnea on exertion, peripheral edema.  Respiratory: Negative for dyspnea at rest, dyspnea on exertion, cough, sputum, wheezing.  GI: See history of present illness. GU:  Negative for dysuria, hematuria, urinary incontinence, urinary frequency, nocturnal urination.  Endo: Negative for unusual weight change.    Physical Examination:   BP 135/85   Pulse 88   Temp 98.1 F (36.7 C)   Resp 18   Ht 5' (1.524 m)   Wt 219 lb 12.8 oz (99.7 kg)   SpO2 95%   BMI 42.93 kg/m   General: Well-nourished, well-developed in no acute distress.   Eyes: No icterus. Conjunctivae pink. Mouth: Oropharyngeal mucosa moist and pink , no lesions erythema or exudate. Lungs: Clear to auscultation bilaterally. Non-labored. Heart: Regular rate and rhythm, no murmurs rubs or gallops.  Abdomen: Bowel sounds are normal, nontender, nondistended, no hepatosplenomegaly or masses, no abdominal bruits or hernia , no rebound or guarding.   Extremities: No lower extremity edema. No clubbing or deformities. Neuro: Alert and oriented x 3.  Grossly intact. Skin: Warm and dry, no jaundice.   Psych: Alert and cooperative, normal mood and affect.   Imaging Studies: Dg Chest 1 View  Result Date: 12/28/2015 CLINICAL DATA:  Fall and generalized weakness. EXAM: CHEST 1 VIEW COMPARISON:  None. FINDINGS: The heart size and mediastinal contours are within normal limits. Lungs show bibasilar atelectasis. There is no evidence of pulmonary edema, consolidation, pneumothorax, nodule or pleural fluid. Healed bilateral rib fractures evident. IMPRESSION: No acute findings.  Bibasilar atelectasis. Electronically Signed   By: Irish LackGlenn  Yamagata M.D.   On: 12/28/2015 13:22   Ct Abdomen Pelvis W Contrast  Result Date: 12/28/2015 CLINICAL DATA:  Fall with weakness, nausea and vomiting EXAM: CT ABDOMEN AND PELVIS WITH CONTRAST TECHNIQUE: Multidetector CT imaging of the abdomen and pelvis was performed using the standard protocol following bolus administration of intravenous contrast. CONTRAST:  100mL ISOVUE-300 IOPAMIDOL (ISOVUE-300) INJECTION 61% COMPARISON:  Radiographs 12/28/2015 FINDINGS: Lower chest: Visualized lung bases demonstrate no acute consolidation or pleural effusion. Hepatobiliary: There is diffuse decreased density of the hepatic parenchyma, consistent with fatty infiltration. No focal hepatic abnormality. There is somewhat small appearance of the right hepatic lobe, correlate with surgical history. Irregular enhancement within the right hepatic lobe this felt to relate  to unusual appearance of vascular enhancement, likely hepatic venous. There is no focal hepatic abnormality. Dilated extrahepatic common bile duct, measuring up to 2 cm in diameter. A well-formed gallbladder is not clearly identified. There is slight prominence of the pancreatic duct at the head of the pancreas. No calcified stones. Pancreas: Atrophic. Prominent pancreatic duct. There is a 1.5 cm cystic appearing lesion in the tail of the pancreas, series 2, image number 22. No peripancreatic inflammation. Spleen: Normal in size without focal abnormality. Adrenals/Urinary Tract: Left adrenal gland is within normal limits. 2.6 cm right adrenal gland mass without demonstrable fatty density. Bilateral kidneys are within normal limits. Bladder is largely obscured by streak artifact from hip replacement. Stomach/Bowel: Stomach is nonenlarged. No dilated bowel. 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 Appendix is not well visualized. Vascular/Lymphatic: Tortuous pelvic and gonadal vessels. Atherosclerosis of the aorta. No abnormally enlarged lymph nodes. Reproductive: Uterus and bilateral adnexa are unremarkable. Other: No free air or free fluid Musculoskeletal: Bilateral hip replacements. Deformity of L2 vertebral body with osseous bridging anteriorly at L1 and L2, possibly related to old trauma or congenital malformation. IMPRESSION: 1. Fatty infiltration of the liver. 2. Markedly enlargement  of extrahepatic common bowel duct up to 2 cm with slight prominence of pancreatic duct. Recommend correlation with laboratory values to evaluate for obstruction. Further evaluation with MR or ERCP may be obtained as clinically indicated 3. 2.6 cm right adrenal gland mass. Nonemergent evaluation with adrenal CT or MR may be obtained. 4. Atrophic pancreas. 1.5 cm cystic lesion in the tail of the pancreas, for which nonemergent follow-up CT or MRI pancreas protocol is recommended. Electronically Signed   By: Jasmine Pang M.D.   On: 12/28/2015 16:48   Mr Abdomen Mrcp Vivien Rossetti Contast  Result Date: 12/30/2015 CLINICAL DATA:  Biliary duct dilatation. Hepatic steatosis and possible cirrhosis. EXAM: MRI ABDOMEN WITHOUT AND WITH CONTRAST (INCLUDING MRCP) TECHNIQUE: Multiplanar multisequence MR imaging of the abdomen was performed both before and after the administration of intravenous contrast. Heavily T2-weighted images of the biliary and pancreatic ducts were obtained, and three-dimensional MRCP images were rendered by post processing. CONTRAST:  20mL MULTIHANCE GADOBENATE DIMEGLUMINE 529 MG/ML IV SOLN COMPARISON:  Multiple exams, including 12/29/2015 and 12/28/2015 FINDINGS: Despite efforts by the technologist and patient, motion artifact is present on today's exam and could not be eliminated. This reduces exam sensitivity and specificity. Lower chest: Unremarkable Hepatobiliary: Hepatic cirrhosis.  Mild diffuse hepatic steatosis. There is only minimal intrahepatic biliary dilatation particularly in the left hepatic lobe. Common hepatic duct 1.5 cm. Common bile duct 1.5 cm. The CBD demonstrates very minimally blunted distal termination at the ampulla without a visible filling defect. I do not perceive an abnormal enhancing lesion along the biliary tree or ampulla, with the understanding that the motion artifact is fairly severe in this reduces diagnostic sensitivity and specificity. No abnormal enhancing liver mass seen. Pancreas: There several small clustered cystic lesions along the tail the pancreas, the largest is 1.4 cm in diameter but several others are in the 3-5 mm range. I do not see any dilatation of the dorsal pancreatic duct in connectivity of the small cystic lesions to the dorsal pancreatic duct is not established. No abnormal enhancement along these lesions or elsewhere in the pancreas is perceived, again with the understanding that sensitivity is reduced due to motion artifact. Spleen:  Unremarkable  Adrenals/Urinary Tract:  Unremarkable Stomach/Bowel: Small periampullary duodenal diverticulum. Vascular/Lymphatic:  Unremarkable Other:  No supplemental non-categorized findings. Musculoskeletal: Unremarkable IMPRESSION: 1. The extrahepatic biliary tree measures up to 1.5 cm in diameter, without a definite filling defect and with slightly blunted tapering in the ampulla. I do not see a definite ampullary mass or other cause for this dilatation, some of which may simply be due to physiologic response to cholecystectomy. Please note that sensitivity for small lesions is reduced due to the severity of motion artifact. A small periampullary duodenal diverticulum is present. 2. Hepatic cirrhosis without visible mass. 3. Several small cystic lesions along the tail the pancreas are probably postinflammatory. The largest measures 1.4 cm in diameter. No definite connectivity to the biliary tree. The possibility of a small mucinous tumor like IPMN is not totally excluded. Based on the patient's age of 10, current guidelines call for reimaging every 2 years to assess such lesions. Accordingly, pancreatic protocol CT or MRI is recommended in 2 years time in order to reassess. This recommendation follows ACR consensus guidelines: Management of Incidental Pancreatic Cysts: A White Paper of the ACR Incidental Findings Committee. J Am Coll Radiol 2017;14:911-923. Electronically Signed   By: Gaylyn Rong M.D.   On: 12/30/2015 13:18   Dg Hip Unilat W Or Wo  Pelvis 2-3 Views Left  Result Date: 12/28/2015 CLINICAL DATA:  The patient slipped in a bathroom 3 days ago resulting in a fall and left hip pain. Initial encounter. EXAM: DG HIP (WITH OR WITHOUT PELVIS) 2-3V LEFT COMPARISON:  None. FINDINGS: Bilateral total hip replacements are in place. Hardware is intact. No acute abnormality is identified. IMPRESSION: No acute abnormality. Status post bilateral total hip replacement. Electronically Signed   By: Drusilla Kanner M.D.    On: 12/28/2015 13:22   Dg Femur Min 2 Views Right  Result Date: 12/28/2015 CLINICAL DATA:  Right hip and upper leg pain since a fall in bathroom 3 days ago. Initial encounter. EXAM: RIGHT FEMUR 2 VIEWS COMPARISON:  None. FINDINGS: No acute bony or joint abnormality is identified. Right total hip and knee replacements are in place. Hardware is intact. IMPRESSION: No acute abnormality. Electronically Signed   By: Drusilla Kanner M.D.   On: 12/28/2015 13:24   US Abdomen Limited Ruq  Result Date: 12/29/2015 CLINICAL DATA:  Two week history of abdominal pain EXAM: US ABDOMEN LIMITED - RIGHT UPPER QUADRANT COMPARISON:  CT abdomen and pelvis December 28, 2015 FINDINGS: Gallbladder: Surgically absent. Common bile duct: Diameter: Dilated, measuring between 1.4 and 1.7 cm. No mass or calculus seen by ultrasound in the biliary ductal system. Liver: No focal lesion identified. The liver has a somewhat lobular appearance with diffuse increased echogenicity. IMPRESSION: Biliary duct dilatation without mass or calculus evident. This finding may warrant MRCP for further assessment given the degree of biliary duct dilatation evident. Gallbladder absent. Increased liver echogenicity is somewhat lobular appearance. This appearance is consistent with hepatic steatosis with possible underlying disease such as cirrhosis. While no focal liver lesions are evident, it must be cautioned that the sensitivity of ultrasound for detection of focal liver lesions is diminished significantly in this circumstance. Electronically Signed   By: Bretta Bang III M.D.   On: 12/29/2015 09:27    Assessment and Plan:   Jamella Grayer is a 66 y.o. y/o female here to follow up to recent hospital discharge where she was noted to have biliary dilation on Ct abdomen .   1.Dilated extrahepatic bile duct : EUS to rule out periampullary adenoma as cause for biliary dilation , evaluate cystic lesions of the pancreas 2. Hepatic cirrhosis -  will check for viral and autoimmune hepatitis , could be alcohol related. Will need Q 6 monthly liver imaging , EGD to screen for esophageal varices. Will check immunity to hep A/B virus.   Dr Wyline Mood  MD Follow up in 8 weeks.

## 2016-01-20 NOTE — Patient Instructions (Addendum)
Alcoholic Liver Disease Introduction Alcoholic liver disease happens when the liver does not work the way it should. The condition is caused by drinking too much alcohol for many years. Follow these instructions at home:  Do not drink alcohol.  Take medicines only as told by your doctor.  Take vitamins only as told by your doctor.  Follow any diet instructions that your doctor gave you. You may need to:  Eat foods that have thiamine. These include whole-wheat cereals, pork, and raw vegetables.  Eat foods that have folic acid. These include vegetables, fruits, meats, beans, nuts, and dairy foods.  Eat foods that are high in carbohydrates. These include yogurt, beans, potatoes, and rice. Contact a doctor if:  You have a fever.  You are short of breath.  You have trouble breathing.  You have bright red blood in your poop (stool).  Your poop looks like tar.  You throw up (vomit) blood.  Your skin looks more yellow, pale, or dark.  You get headaches.  You have trouble thinking.  You have trouble balancing or walking. This information is not intended to replace advice given to you by your health care provider. Make sure you discuss any questions you have with your health care provider. Document Released: 10/31/2008 Document Revised: 06/11/2015 Document Reviewed: 12/05/2013  2017 Elsevier   --  Cirrhosis Cirrhosis is long-term (chronic) liver injury. The liver is your largest internal organ, and it performs many functions. The liver converts food into energy, removes toxic material from your blood, makes important proteins, and absorbs necessary vitamins from your diet. If you have cirrhosis, it means many of your healthy liver cells have been replaced by scar tissue. This prevents blood from flowing through your liver, which makes it difficult for your liver to function. This scarring is not reversible, but treatment can prevent it from getting worse. What are the  causes? Hepatitis C and long-term alcohol abuse are the most common causes of cirrhosis. Other causes include:  Nonalcoholic fatty liver disease.  Hepatitis B infection.  Autoimmune hepatitis.  Diseases that cause blockage of ducts inside the liver.  Inherited liver diseases.  Reactions to certain long-term medicines.  Parasitic infections.  Long-term exposure to certain toxins. What increases the risk? You may have a higher risk of cirrhosis if you:  Have certain hepatitis viruses.  Abuse alcohol, especially if you are female.  Are overweight.  Share needles.  Have unprotected sex with someone who has hepatitis. What are the signs or symptoms? You may not have any signs and symptoms at first. Symptoms may not develop until the damage to your liver starts to get worse. Signs and symptoms of cirrhosis may include:  Tenderness in the right-upper part of your abdomen.  Weakness and tiredness (fatigue).  Loss of appetite.  Nausea.  Weight loss and muscle loss.  Itchiness.  Yellow skin and eyes (jaundice).  Buildup of fluid in the abdomen (ascites).  Swelling of the feet and ankles (edema).  Appearance of tiny blood vessels under the skin.  Mental confusion.  Easy bruising and bleeding. How is this diagnosed? Your health care provider may suspect cirrhosis based on your symptoms and medical history, especially if you have other medical conditions or a history of alcohol abuse. Your health care provider will do a physical exam to feel your liver and check for signs of cirrhosis. Your health care provider may perform other tests, including:  Blood tests to check:  Whether you have hepatitis B or C.    Kidney function.  Liver function.  Imaging tests such as:  MRI or CT scan to look for changes seen in advanced cirrhosis.  Ultrasound to see if normal liver tissue is being replaced by scar tissue.  A procedure using a long needle to take a sample of liver  tissue (biopsy) for examination under a microscope. Liver biopsy can confirm the diagnosis of cirrhosis. How is this treated? Treatment depends on how damaged your liver is and what caused the damage. Treatment may include treating cirrhosis symptoms or treating the underlying causes of the condition to try to slow the progression of the damage. Treatment may include:  Making lifestyle changes, such as:  Eating a healthy diet.  Restricting salt intake.  Maintaining a healthy weight.  Not abusing drugs or alcohol.  Taking medicines to:  Treat liver infections or other infections.  Control itching.  Reduce fluid buildup.  Reduce certain blood toxins.  Reduce risk of bleeding from enlarged blood vessels in the stomach or esophagus (varices).  If varices are causing bleeding problems, you may need treatment with a procedure that ties up the vessels causing them to fall off (band ligation).  If cirrhosis is causing your liver to fail, your health care provider may recommend a liver transplant.  Other treatments may be recommended depending on any complications of cirrhosis, such as liver-related kidney failure (hepatorenal syndrome). Follow these instructions at home:  Take medicines only as directed by your health care provider. Do not use drugs that are toxic to your liver. Ask your health care provider before taking any new medicines, including over-the-counter medicines.  Rest as needed.  Eat a well-balanced diet. Ask your health care provider or dietitian for more information.  You may have to follow a low-salt diet or restrict your water intake as directed.  Do not drink alcohol. This is especially important if you are taking acetaminophen.  Keep all follow-up visits as directed by your health care provider. This is important. Contact a health care provider if:  You have fatigue or weakness that is getting worse.  You develop swelling of the hands, feet, legs, or  face.  You have a fever.  You develop loss of appetite.  You have nausea or vomiting.  You develop jaundice.  You develop easy bruising or bleeding. Get help right away if:  You vomit bright red blood or a material that looks like coffee grounds.  You have blood in your stools.  Your stools appear black and tarry.  You become confused.  You have chest pain or trouble breathing. This information is not intended to replace advice given to you by your health care provider. Make sure you discuss any questions you have with your health care provider. Document Released: 01/03/2005 Document Revised: 05/14/2015 Document Reviewed: 09/11/2013 Elsevier Interactive Patient Education  2017 Elsevier Inc.  

## 2016-01-22 ENCOUNTER — Telehealth: Payer: Self-pay

## 2016-01-22 NOTE — Telephone Encounter (Signed)
  Oncology Nurse Navigator Documentation Received referral from Dr. Darien RamusAna for upper EUS. They would like procedure performed before next available appt at [facility] which is in February. Voicemail left with Ms. Lazo to return call to arrange for scheduling of EUS. Navigator Location: [facility] (01/22/16 1000)   )Navigator Encounter Type: Telephone (01/22/16 1000) Telephone: Outgoing Call (01/22/16 1000)                       Barriers/Navigation Needs: Coordination of Care (01/22/16 1000)   Interventions: Coordination of Care (01/22/16 1000)   Coordination of Care: EUS (01/22/16 1000)                  Time Spent with Patient: 15 (01/22/16 1000)

## 2016-01-26 ENCOUNTER — Telehealth: Payer: Self-pay

## 2016-01-26 NOTE — Telephone Encounter (Signed)
  Oncology Nurse Navigator Documentation Called and spoke with Shelby Stewart regarding referral for EUS. Dr. Johnney KillianAnna's office would like EUS performed before next available appt at [facility] which is in February. [facility]. Records sent top [facility] for scheduling with confirmation. They will contact her regarding appt date/time/instructions. .Navigator Location: [facility] (01/26/16 1100)   )Navigator Encounter Type: Telephone (01/26/16 1100) Telephone: Outgoing Call (01/26/16 1100)                       Barriers/Navigation Needs: Coordination of Care (01/26/16 1100)   Interventions: Coordination of Care (01/26/16 1100)   Coordination of Care: EUS (01/26/16 1100)                  Time Spent with Patient: 15 (01/26/16 1100)

## 2016-02-15 NOTE — Progress Notes (Signed)
  Oncology Nurse Navigator Documentation Notified Ginger at [facility] that Shelby Stewart [facility] and it was cancelled due to the recent inclement weather. At this time she has not rescheduled. Navigator Location: [facility] (02/15/16 0900)   )Navigator Encounter Type: Other (02/15/16 0900)                                 Coordination of Care: EUS (02/15/16 0900)                  Time Spent with Patient: 15 (02/15/16 0900)

## 2016-02-18 DIAGNOSIS — Z79899 Other long term (current) drug therapy: Secondary | ICD-10-CM | POA: Insufficient documentation

## 2016-02-18 DIAGNOSIS — Z7984 Long term (current) use of oral hypoglycemic drugs: Secondary | ICD-10-CM | POA: Diagnosis not present

## 2016-02-18 DIAGNOSIS — E119 Type 2 diabetes mellitus without complications: Secondary | ICD-10-CM | POA: Insufficient documentation

## 2016-02-18 DIAGNOSIS — F10129 Alcohol abuse with intoxication, unspecified: Secondary | ICD-10-CM | POA: Insufficient documentation

## 2016-02-18 DIAGNOSIS — J449 Chronic obstructive pulmonary disease, unspecified: Secondary | ICD-10-CM | POA: Diagnosis not present

## 2016-02-18 DIAGNOSIS — I10 Essential (primary) hypertension: Secondary | ICD-10-CM | POA: Diagnosis not present

## 2016-02-18 DIAGNOSIS — M79604 Pain in right leg: Secondary | ICD-10-CM | POA: Insufficient documentation

## 2016-02-18 DIAGNOSIS — J45909 Unspecified asthma, uncomplicated: Secondary | ICD-10-CM | POA: Insufficient documentation

## 2016-02-18 DIAGNOSIS — M79605 Pain in left leg: Secondary | ICD-10-CM | POA: Diagnosis not present

## 2016-02-18 DIAGNOSIS — G8929 Other chronic pain: Secondary | ICD-10-CM | POA: Insufficient documentation

## 2016-02-18 LAB — COMPREHENSIVE METABOLIC PANEL
ALBUMIN: 3.9 g/dL (ref 3.5–5.0)
ALK PHOS: 106 U/L (ref 38–126)
ALT: 21 U/L (ref 14–54)
AST: 42 U/L — AB (ref 15–41)
Anion gap: 25 — ABNORMAL HIGH (ref 5–15)
BILIRUBIN TOTAL: 1.8 mg/dL — AB (ref 0.3–1.2)
BUN: 14 mg/dL (ref 6–20)
CO2: 14 mmol/L — ABNORMAL LOW (ref 22–32)
CREATININE: 0.73 mg/dL (ref 0.44–1.00)
Calcium: 8.2 mg/dL — ABNORMAL LOW (ref 8.9–10.3)
Chloride: 97 mmol/L — ABNORMAL LOW (ref 101–111)
GFR calc Af Amer: >60 mL/min (ref >60)
Glucose, Bld: 142 mg/dL — ABNORMAL HIGH (ref 65–99)
POTASSIUM: 3.8 mmol/L (ref 3.5–5.1)
Sodium: 136 mmol/L (ref 135–145)
TOTAL PROTEIN: 7 g/dL (ref 6.5–8.1)

## 2016-02-18 LAB — CBC
HEMATOCRIT: 42.2 % (ref 35.0–47.0)
HEMOGLOBIN: 14.4 g/dL (ref 12.0–16.0)
MCH: 31 pg (ref 26.0–34.0)
MCHC: 34 g/dL (ref 32.0–36.0)
MCV: 91.1 fL (ref 80.0–100.0)
Platelets: 230 10*3/uL (ref 150–440)
RBC: 4.63 MIL/uL (ref 3.80–5.20)
RDW: 15.9 % — AB (ref 11.5–14.5)
WBC: 11.3 10*3/uL — AB (ref 3.6–11.0)

## 2016-02-18 LAB — ETHANOL: ALCOHOL ETHYL (B): 258 mg/dL — AB (ref <5)

## 2016-02-18 NOTE — ED Triage Notes (Addendum)
Pt in by [facility] has been drinking for few days, friend called concerned about her alcohol intake. Pt has chronic leg pain no other concerns.

## 2016-02-19 ENCOUNTER — Emergency Department
Admission: EM | Admit: 2016-02-19 | Discharge: 2016-02-19 | Disposition: A | Discharge status: Home / Self Care | Payer: Medicare Other | Attending: Emergency Medicine | Admitting: Emergency Medicine

## 2016-02-19 DIAGNOSIS — G8929 Other chronic pain: Secondary | ICD-10-CM

## 2016-02-19 DIAGNOSIS — M79605 Pain in left leg: Secondary | ICD-10-CM

## 2016-02-19 DIAGNOSIS — F10920 Alcohol use, unspecified with intoxication, uncomplicated: Secondary | ICD-10-CM

## 2016-02-19 DIAGNOSIS — M79604 Pain in right leg: Secondary | ICD-10-CM

## 2016-02-19 MED ORDER — LORAZEPAM 2 MG/ML IJ SOLN
0.5000 mg | Freq: Once | INTRAMUSCULAR | Status: AC
  Administered 2016-02-19: 0.5 mg via INTRAVENOUS
  Filled 2016-02-19: qty 1

## 2016-02-19 MED ORDER — LORAZEPAM 2 MG/ML IJ SOLN
1.0000 mg | Freq: Once | INTRAMUSCULAR | Status: AC
  Administered 2016-02-19: 1 mg via INTRAVENOUS

## 2016-02-19 MED ORDER — LORAZEPAM 1 MG PO TABS
ORAL_TABLET | ORAL | Status: AC
  Filled 2016-02-19: qty 1

## 2016-02-19 MED ORDER — LORAZEPAM 1 MG PO TABS
1.0000 mg | ORAL_TABLET | Freq: Once | ORAL | Status: AC
  Administered 2016-02-19: 1 mg via ORAL

## 2016-02-19 MED ORDER — SODIUM CHLORIDE 0.9 % IV BOLUS (SEPSIS)
1000.0000 mL | Freq: Once | INTRAVENOUS | Status: AC
  Administered 2016-02-19: 1000 mL via INTRAVENOUS

## 2016-02-19 MED ORDER — LORAZEPAM 2 MG/ML IJ SOLN
INTRAMUSCULAR | Status: AC
  Filled 2016-02-19: qty 1

## 2016-02-19 NOTE — ED Notes (Signed)
Pt states that she has been drinking frequently for approx 1 month. Was drinking vodka heavily "yesterday" and her friend came over, concerned about her intoxication and called EMS. Pt states that she was drinking due to chronic leg pain. Pt reports that she takes Percocet for pain but just moved here and does not have any. Pt requesting trazadone at this time. Pt states that she just wants to sleep and that she wants the pain to go away.

## 2016-02-19 NOTE — Discharge Instructions (Signed)
Drink alcohol only in moderation. Return to the ER for persistent vomiting, difficulty breathing, feelings of hurting yourself or others, or other concerns.

## 2016-02-19 NOTE — ED Notes (Addendum)
Called Emergency Contact, Maureen RalphsVivian. Reached voicemail. Left HIPPA compliant message requesting a call back.   Providing pt with phone to continue trying to reach contact to pick up pt for d/c.

## 2016-02-19 NOTE — ED Notes (Signed)
ED Provider at bedside. 

## 2016-02-19 NOTE — ED Notes (Signed)
Pt. Provided new pants and undergarments at this time. Bed cleaned and changed. Warm blanket provided.
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
## 2016-02-19 NOTE — ED Notes (Signed)

## 2016-03-04 NOTE — Progress Notes (Signed)
  Oncology Nurse Navigator Documentation Per [facility] note regarding EUS:  Hello-   Marylene Landngela spoke to Ms. Stai today.The patient said she spoke to her PCP and they said procedure is not needed. She said she is on a fixed income and she doesn't want to come unless it is absolutely necessary.   Thanks,   Richrd HumblesBeth Campbell, CMAA  Patient Service Representative  [facility]  [phone]   Notified Ginger via inbox regarding same.  Navigator Location: [facility] (03/04/16 1100)   )Navigator Encounter Type: Other (03/04/16 1100)                                 Coordination of Care: EUS (03/04/16 1100)                  Time Spent with Patient: 15 (03/04/16 1100)

## 2016-04-21 ENCOUNTER — Encounter: Payer: Self-pay | Admitting: Emergency Medicine

## 2016-04-21 ENCOUNTER — Emergency Department: Payer: Medicare Other

## 2016-04-21 ENCOUNTER — Inpatient Hospital Stay
Admission: EM | Admit: 2016-04-21 | Discharge: 2016-04-26 | DRG: 557 | Disposition: A | Discharge status: Skilled Nursing Facility | Payer: Medicare Other | Attending: Internal Medicine | Admitting: Internal Medicine

## 2016-04-21 DIAGNOSIS — M79605 Pain in left leg: Secondary | ICD-10-CM | POA: Diagnosis present

## 2016-04-21 DIAGNOSIS — Z882 Allergy status to sulfonamides status: Secondary | ICD-10-CM

## 2016-04-21 DIAGNOSIS — R4182 Altered mental status, unspecified: Secondary | ICD-10-CM

## 2016-04-21 DIAGNOSIS — J449 Chronic obstructive pulmonary disease, unspecified: Secondary | ICD-10-CM | POA: Diagnosis present

## 2016-04-21 DIAGNOSIS — Z8249 Family history of ischemic heart disease and other diseases of the circulatory system: Secondary | ICD-10-CM | POA: Diagnosis not present

## 2016-04-21 DIAGNOSIS — R410 Disorientation, unspecified: Secondary | ICD-10-CM | POA: Diagnosis present

## 2016-04-21 DIAGNOSIS — Z96642 Presence of left artificial hip joint: Secondary | ICD-10-CM | POA: Diagnosis present

## 2016-04-21 DIAGNOSIS — Y900 Blood alcohol level of less than 20 mg/100 ml: Secondary | ICD-10-CM | POA: Diagnosis present

## 2016-04-21 DIAGNOSIS — Z880 Allergy status to penicillin: Secondary | ICD-10-CM | POA: Diagnosis not present

## 2016-04-21 DIAGNOSIS — R200 Anesthesia of skin: Secondary | ICD-10-CM

## 2016-04-21 DIAGNOSIS — K5900 Constipation, unspecified: Secondary | ICD-10-CM | POA: Diagnosis present

## 2016-04-21 DIAGNOSIS — R413 Other amnesia: Secondary | ICD-10-CM | POA: Diagnosis present

## 2016-04-21 DIAGNOSIS — Z96651 Presence of right artificial knee joint: Secondary | ICD-10-CM | POA: Diagnosis present

## 2016-04-21 DIAGNOSIS — F1010 Alcohol abuse, uncomplicated: Secondary | ICD-10-CM | POA: Diagnosis present

## 2016-04-21 DIAGNOSIS — I248 Other forms of acute ischemic heart disease: Secondary | ICD-10-CM | POA: Diagnosis present

## 2016-04-21 DIAGNOSIS — M6282 Rhabdomyolysis: Principal | ICD-10-CM | POA: Diagnosis present

## 2016-04-21 DIAGNOSIS — M25511 Pain in right shoulder: Secondary | ICD-10-CM | POA: Diagnosis present

## 2016-04-21 DIAGNOSIS — Z6841 Body Mass Index (BMI) 40.0 and over, adult: Secondary | ICD-10-CM

## 2016-04-21 DIAGNOSIS — E785 Hyperlipidemia, unspecified: Secondary | ICD-10-CM | POA: Diagnosis present

## 2016-04-21 DIAGNOSIS — E119 Type 2 diabetes mellitus without complications: Secondary | ICD-10-CM | POA: Diagnosis present

## 2016-04-21 DIAGNOSIS — R270 Ataxia, unspecified: Secondary | ICD-10-CM | POA: Diagnosis present

## 2016-04-21 DIAGNOSIS — Y92009 Unspecified place in unspecified non-institutional (private) residence as the place of occurrence of the external cause: Secondary | ICD-10-CM | POA: Diagnosis not present

## 2016-04-21 DIAGNOSIS — M79604 Pain in right leg: Secondary | ICD-10-CM | POA: Diagnosis present

## 2016-04-21 DIAGNOSIS — W010XXA Fall on same level from slipping, tripping and stumbling without subsequent striking against object, initial encounter: Secondary | ICD-10-CM | POA: Diagnosis present

## 2016-04-21 DIAGNOSIS — Z881 Allergy status to other antibiotic agents status: Secondary | ICD-10-CM | POA: Diagnosis not present

## 2016-04-21 DIAGNOSIS — Z7951 Long term (current) use of inhaled steroids: Secondary | ICD-10-CM

## 2016-04-21 DIAGNOSIS — R29898 Other symptoms and signs involving the musculoskeletal system: Secondary | ICD-10-CM

## 2016-04-21 DIAGNOSIS — R7989 Other specified abnormal findings of blood chemistry: Secondary | ICD-10-CM

## 2016-04-21 DIAGNOSIS — Z9109 Other allergy status, other than to drugs and biological substances: Secondary | ICD-10-CM

## 2016-04-21 DIAGNOSIS — Z79899 Other long term (current) drug therapy: Secondary | ICD-10-CM

## 2016-04-21 DIAGNOSIS — Z7984 Long term (current) use of oral hypoglycemic drugs: Secondary | ICD-10-CM

## 2016-04-21 DIAGNOSIS — G9341 Metabolic encephalopathy: Secondary | ICD-10-CM | POA: Diagnosis present

## 2016-04-21 DIAGNOSIS — R778 Other specified abnormalities of plasma proteins: Secondary | ICD-10-CM

## 2016-04-21 DIAGNOSIS — I10 Essential (primary) hypertension: Secondary | ICD-10-CM | POA: Diagnosis present

## 2016-04-21 DIAGNOSIS — Z87891 Personal history of nicotine dependence: Secondary | ICD-10-CM

## 2016-04-21 LAB — CBC WITH DIFFERENTIAL/PLATELET
Basophils Absolute: 0.2 10*3/uL — ABNORMAL HIGH (ref 0–0.1)
Basophils Relative: 1 %
Eosinophils Absolute: 0 10*3/uL (ref 0–0.7)
Eosinophils Relative: 0 %
HEMATOCRIT: 46.6 % (ref 35.0–47.0)
Hemoglobin: 15.6 g/dL (ref 12.0–16.0)
Lymphocytes Relative: 11 %
Lymphs Abs: 1.5 10*3/uL (ref 1.0–3.6)
MCH: 31.2 pg (ref 26.0–34.0)
MCHC: 33.6 g/dL (ref 32.0–36.0)
MCV: 92.9 fL (ref 80.0–100.0)
MONO ABS: 1.3 10*3/uL — AB (ref 0.2–0.9)
MONOS PCT: 9 %
NEUTROS ABS: 10.8 10*3/uL — AB (ref 1.4–6.5)
Neutrophils Relative %: 79 %
Platelets: 174 10*3/uL (ref 150–440)
RBC: 5.01 MIL/uL (ref 3.80–5.20)
RDW: 15.9 % — AB (ref 11.5–14.5)
WBC: 13.7 10*3/uL — ABNORMAL HIGH (ref 3.6–11.0)

## 2016-04-21 LAB — HEPATIC FUNCTION PANEL
ALBUMIN: 3.5 g/dL (ref 3.5–5.0)
ALK PHOS: 88 U/L (ref 38–126)
ALT: 36 U/L (ref 14–54)
AST: 74 U/L — AB (ref 15–41)
BILIRUBIN DIRECT: 0.5 mg/dL (ref 0.1–0.5)
BILIRUBIN TOTAL: 2.3 mg/dL — AB (ref 0.3–1.2)
Indirect Bilirubin: 1.8 mg/dL — ABNORMAL HIGH (ref 0.3–0.9)
Total Protein: 6.8 g/dL (ref 6.5–8.1)

## 2016-04-21 LAB — URINALYSIS, COMPLETE (UACMP) WITH MICROSCOPIC
BACTERIA UA: NONE SEEN
Bilirubin Urine: NEGATIVE
GLUCOSE, UA: 150 mg/dL — AB
KETONES UR: 80 mg/dL — AB
Leukocytes, UA: NEGATIVE
NITRITE: NEGATIVE
PROTEIN: 30 mg/dL — AB
Specific Gravity, Urine: 1.025 (ref 1.005–1.030)
pH: 6 (ref 5.0–8.0)

## 2016-04-21 LAB — CREATININE, SERUM
CREATININE: 0.86 mg/dL (ref 0.44–1.00)
GFR calc Af Amer: >60 mL/min (ref >60)
GFR calc non Af Amer: >60 mL/min (ref >60)

## 2016-04-21 LAB — CBC
HCT: 48 % — ABNORMAL HIGH (ref 35.0–47.0)
Hemoglobin: 16.2 g/dL — ABNORMAL HIGH (ref 12.0–16.0)
MCH: 31.6 pg (ref 26.0–34.0)
MCHC: 33.8 g/dL (ref 32.0–36.0)
MCV: 93.4 fL (ref 80.0–100.0)
PLATELETS: 111 10*3/uL — AB (ref 150–440)
RBC: 5.13 MIL/uL (ref 3.80–5.20)
RDW: 16 % — AB (ref 11.5–14.5)
WBC: 13.9 10*3/uL — ABNORMAL HIGH (ref 3.6–11.0)

## 2016-04-21 LAB — URINE DRUG SCREEN, QUALITATIVE (ARMC ONLY)
AMPHETAMINES, UR SCREEN: NOT DETECTED
Barbiturates, Ur Screen: NOT DETECTED
Benzodiazepine, Ur Scrn: NOT DETECTED
CANNABINOID 50 NG, UR ~~LOC~~: NOT DETECTED
COCAINE METABOLITE, UR ~~LOC~~: NOT DETECTED
MDMA (ECSTASY) UR SCREEN: NOT DETECTED
Methadone Scn, Ur: NOT DETECTED
Opiate, Ur Screen: NOT DETECTED
PHENCYCLIDINE (PCP) UR S: NOT DETECTED
Tricyclic, Ur Screen: NOT DETECTED

## 2016-04-21 LAB — ETHANOL: Alcohol, Ethyl (B): <5 mg/dL (ref <5)

## 2016-04-21 LAB — CK: CK TOTAL: 1303 U/L — AB (ref 38–234)

## 2016-04-21 LAB — BASIC METABOLIC PANEL
Anion gap: 18 — ABNORMAL HIGH (ref 5–15)
BUN: 18 mg/dL (ref 6–20)
CO2: 16 mmol/L — AB (ref 22–32)
Calcium: 8.9 mg/dL (ref 8.9–10.3)
Chloride: 101 mmol/L (ref 101–111)
Creatinine, Ser: 0.84 mg/dL (ref 0.44–1.00)
GFR calc Af Amer: >60 mL/min (ref >60)
GLUCOSE: 152 mg/dL — AB (ref 65–99)
Potassium: 3.7 mmol/L (ref 3.5–5.1)
Sodium: 135 mmol/L (ref 135–145)

## 2016-04-21 LAB — AMMONIA: Ammonia: 28 umol/L (ref 9–35)

## 2016-04-21 LAB — GLUCOSE, CAPILLARY
GLUCOSE-CAPILLARY: 144 mg/dL — AB (ref 65–99)
GLUCOSE-CAPILLARY: 204 mg/dL — AB (ref 65–99)

## 2016-04-21 LAB — PHOSPHORUS: Phosphorus: 1.3 mg/dL — ABNORMAL LOW (ref 2.5–4.6)

## 2016-04-21 LAB — TSH: TSH: 1.427 u[IU]/mL (ref 0.350–4.500)

## 2016-04-21 LAB — TROPONIN I
Troponin I: 0.07 ng/mL (ref <0.03)
Troponin I: 0.04 ng/mL (ref <0.03)

## 2016-04-21 LAB — MAGNESIUM: Magnesium: 2.2 mg/dL (ref 1.7–2.4)

## 2016-04-21 LAB — LIPASE, BLOOD: Lipase: 51 U/L (ref 11–51)

## 2016-04-21 LAB — PROTIME-INR
INR: 1.16
Prothrombin Time: 14.9 seconds (ref 11.4–15.2)

## 2016-04-21 MED ORDER — THIAMINE HCL 100 MG/ML IJ SOLN: 100.0000 mg | Freq: Every day | INTRAMUSCULAR | Status: DC

## 2016-04-21 MED ORDER — FOLIC ACID 1 MG PO TABS
1.0000 mg | ORAL_TABLET | Freq: Every day | ORAL | Status: DC
  Administered 2016-04-21 – 2016-04-26 (×6): 1 mg via ORAL
  Filled 2016-04-21 (×6): qty 1

## 2016-04-21 MED ORDER — ONDANSETRON HCL 4 MG PO TABS
4.0000 mg | ORAL_TABLET | Freq: Four times a day (QID) | ORAL | Status: DC | PRN
  Administered 2016-04-22: 4 mg via ORAL
  Filled 2016-04-21: qty 1

## 2016-04-21 MED ORDER — SODIUM CHLORIDE 0.9 % IV SOLN
Freq: Once | INTRAVENOUS | Status: AC
  Administered 2016-04-21: 14:00:00 via INTRAVENOUS
  Filled 2016-04-21: qty 5

## 2016-04-21 MED ORDER — VITAMIN B-1 100 MG PO TABS: 100.0000 mg | ORAL_TABLET | Freq: Every day | ORAL | Status: DC

## 2016-04-21 MED ORDER — SODIUM CHLORIDE 0.9% FLUSH
3.0000 mL | Freq: Two times a day (BID) | INTRAVENOUS | Status: DC
  Administered 2016-04-21 – 2016-04-26 (×9): 3 mL via INTRAVENOUS

## 2016-04-21 MED ORDER — SENNOSIDES-DOCUSATE SODIUM 8.6-50 MG PO TABS: 1.0000 | ORAL_TABLET | Freq: Every evening | ORAL | Status: DC | PRN

## 2016-04-21 MED ORDER — HYDROCODONE-ACETAMINOPHEN 5-325 MG PO TABS
1.0000 | ORAL_TABLET | ORAL | Status: DC | PRN
  Administered 2016-04-21: 18:00:00 2 via ORAL
  Administered 2016-04-22 (×2): 1 via ORAL
  Administered 2016-04-23: 09:00:00 2 via ORAL
  Administered 2016-04-23: 1 via ORAL
  Administered 2016-04-24 (×3): 2 via ORAL
  Administered 2016-04-25: 1 via ORAL
  Administered 2016-04-25 – 2016-04-26 (×2): 2 via ORAL
  Filled 2016-04-21: qty 1
  Filled 2016-04-21 (×2): qty 2
  Filled 2016-04-21: qty 1
  Filled 2016-04-21 (×5): qty 2
  Filled 2016-04-21 (×2): qty 1

## 2016-04-21 MED ORDER — ONDANSETRON HCL 4 MG/2ML IJ SOLN: 4.0000 mg | Freq: Four times a day (QID) | INTRAMUSCULAR | Status: DC | PRN

## 2016-04-21 MED ORDER — MAGNESIUM SULFATE 2 GM/50ML IV SOLN
2.0000 g | Freq: Once | INTRAVENOUS | Status: AC
  Administered 2016-04-21: 2 g via INTRAVENOUS
  Filled 2016-04-21: qty 50

## 2016-04-21 MED ORDER — CEPASTAT 14.5 MG MT LOZG
1.0000 | LOZENGE | OROMUCOSAL | Status: DC | PRN
  Administered 2016-04-22: 1 via BUCCAL
  Filled 2016-04-21: qty 9

## 2016-04-21 MED ORDER — VENLAFAXINE HCL ER 75 MG PO CP24
300.0000 mg | ORAL_CAPSULE | Freq: Every day | ORAL | Status: DC
Start: 2016-04-21 — End: 2016-04-26
  Administered 2016-04-21 – 2016-04-26 (×6): 300 mg via ORAL
  Filled 2016-04-21 (×6): qty 4

## 2016-04-21 MED ORDER — SODIUM CHLORIDE 0.9 % IV SOLN
INTRAVENOUS | Status: DC
  Administered 2016-04-21 – 2016-04-23 (×3): via INTRAVENOUS

## 2016-04-21 MED ORDER — SIMVASTATIN 20 MG PO TABS
40.0000 mg | ORAL_TABLET | Freq: Every day | ORAL | Status: DC
  Administered 2016-04-21: 40 mg via ORAL
  Filled 2016-04-21: qty 2

## 2016-04-21 MED ORDER — SODIUM CHLORIDE 0.9 % IV BOLUS (SEPSIS)
500.0000 mL | Freq: Once | INTRAVENOUS | Status: AC
  Administered 2016-04-21: 500 mL via INTRAVENOUS

## 2016-04-21 MED ORDER — LORAZEPAM 2 MG/ML IJ SOLN: 0.0000 mg | Freq: Two times a day (BID) | INTRAMUSCULAR | Status: DC

## 2016-04-21 MED ORDER — INSULIN ASPART 100 UNIT/ML ~~LOC~~ SOLN
0.0000 [IU] | Freq: Three times a day (TID) | SUBCUTANEOUS | Status: DC
  Administered 2016-04-21: 1 [IU] via SUBCUTANEOUS
  Administered 2016-04-22 (×3): 2 [IU] via SUBCUTANEOUS
  Administered 2016-04-23: 17:00:00 3 [IU] via SUBCUTANEOUS
  Administered 2016-04-23: 2 [IU] via SUBCUTANEOUS
  Administered 2016-04-23: 3 [IU] via SUBCUTANEOUS
  Administered 2016-04-24: 08:00:00 2 [IU] via SUBCUTANEOUS
  Filled 2016-04-21 (×2): qty 3
  Filled 2016-04-21 (×5): qty 2
  Filled 2016-04-21: qty 1

## 2016-04-21 MED ORDER — ADULT MULTIVITAMIN W/MINERALS CH
1.0000 | ORAL_TABLET | Freq: Every day | ORAL | Status: DC
  Administered 2016-04-21 – 2016-04-26 (×6): 1 via ORAL
  Filled 2016-04-21 (×5): qty 1

## 2016-04-21 MED ORDER — BISACODYL 5 MG PO TBEC: 5.0000 mg | DELAYED_RELEASE_TABLET | Freq: Every day | ORAL | Status: DC | PRN

## 2016-04-21 MED ORDER — ALBUTEROL SULFATE (2.5 MG/3ML) 0.083% IN NEBU: 3.0000 mL | INHALATION_SOLUTION | Freq: Four times a day (QID) | RESPIRATORY_TRACT | Status: DC | PRN

## 2016-04-21 MED ORDER — LORAZEPAM 1 MG PO TABS
1.0000 mg | ORAL_TABLET | Freq: Four times a day (QID) | ORAL | Status: DC | PRN
  Administered 2016-04-22 – 2016-04-23 (×2): 1 mg via ORAL
  Filled 2016-04-21 (×2): qty 1

## 2016-04-21 MED ORDER — THIAMINE HCL 100 MG/ML IJ SOLN
500.0000 mg | Freq: Three times a day (TID) | INTRAVENOUS | Status: DC
  Administered 2016-04-21 – 2016-04-23 (×5): 500 mg via INTRAVENOUS
  Filled 2016-04-21 (×7): qty 5

## 2016-04-21 MED ORDER — ACETAMINOPHEN 325 MG PO TABS
650.0000 mg | ORAL_TABLET | Freq: Four times a day (QID) | ORAL | Status: DC | PRN
  Administered 2016-04-22: 14:00:00 650 mg via ORAL
  Filled 2016-04-21: qty 2

## 2016-04-21 MED ORDER — PHENOL 1.4 % MT LIQD
1.0000 | OROMUCOSAL | Status: DC | PRN
  Filled 2016-04-21: qty 177

## 2016-04-21 MED ORDER — LORAZEPAM 2 MG PO TABS: 0.0000 mg | ORAL_TABLET | Freq: Two times a day (BID) | ORAL | Status: DC

## 2016-04-21 MED ORDER — FOLIC ACID 1 MG PO TABS: 500.0000 ug | ORAL_TABLET | Freq: Every day | ORAL | Status: DC

## 2016-04-21 MED ORDER — ENOXAPARIN SODIUM 40 MG/0.4ML ~~LOC~~ SOLN
40.0000 mg | SUBCUTANEOUS | Status: DC
  Administered 2016-04-21: 18:00:00 40 mg via SUBCUTANEOUS
  Filled 2016-04-21: qty 0.4

## 2016-04-21 MED ORDER — ACETAMINOPHEN 650 MG RE SUPP: 650.0000 mg | Freq: Four times a day (QID) | RECTAL | Status: DC | PRN

## 2016-04-21 MED ORDER — LORAZEPAM 2 MG/ML IJ SOLN: 0.0000 mg | Freq: Four times a day (QID) | INTRAMUSCULAR | Status: DC

## 2016-04-21 MED ORDER — THIAMINE HCL 100 MG/ML IJ SOLN: 500.0000 mg | Freq: Once | INTRAMUSCULAR | Status: DC

## 2016-04-21 MED ORDER — LORAZEPAM 2 MG/ML IJ SOLN: 1.0000 mg | Freq: Four times a day (QID) | INTRAMUSCULAR | Status: DC | PRN

## 2016-04-21 MED ORDER — LORAZEPAM 2 MG PO TABS: 0.0000 mg | ORAL_TABLET | Freq: Four times a day (QID) | ORAL | Status: DC

## 2016-04-21 MED ORDER — LORAZEPAM 1 MG PO TABS
1.0000 mg | ORAL_TABLET | Freq: Once | ORAL | Status: AC
  Administered 2016-04-21: 1 mg via ORAL
  Filled 2016-04-21: qty 1

## 2016-04-21 NOTE — ED Notes (Signed)
This RN to bedside, pt awakens easily with verbal stimulation. This RN explained delay to patient at this time. Will continue to monitor for further patient needs.

## 2016-04-21 NOTE — ED Triage Notes (Signed)
Pt ems from home. Was found on floor by ?Mudlogger. per ems pt was seen yesterday by care team - per pt she has been on floor for 5 days. Pt has bruises BLE, BUE, torso and top of head. Pt gcs 14

## 2016-04-21 NOTE — H&P (Signed)
[facility] [place] [facility]   PATIENT NAME: Shelby Stewart    MR#:  [id number]  DATE OF BIRTH:  1950/05/19  DATE OF ADMISSION:  04/21/2016  PRIMARY CARE PHYSICIAN: No PCP Per Patient   REQUESTING/REFERRING PHYSICIAN: dr Rogers Seeds  CHIEF COMPLAINT:    Difficulty walking and confusion HISTORY OF PRESENT ILLNESS:  Shelby Stewart  is a 66 y.o. female with a known history of EtOH abuse who presented to the ER via EMS due to above complaint. Patient apparently fell down in her apartment 5 days ago and has been unable to walk since then due to leg pain. Apparently she was screaming in the house supervisor of her apartment complex called 911. According to EMS there were multiple bottles of hard liquor in the apartment.  History of present illness is very limited due to patient's mental status and confusion.  PAST MEDICAL HISTORY:   Past Medical History:  Diagnosis Date  . Asthma   . COPD (chronic obstructive pulmonary disease) (HCC)   . Diabetes mellitus without complication (HCC)   . Hypertension     PAST SURGICAL HISTORY:   Past Surgical History:  Procedure Laterality Date  . REPLACEMENT TOTAL KNEE Left   . TOTAL HIP ARTHROPLASTY Left   . TOTAL HIP ARTHROPLASTY Left   . TOTAL KNEE ARTHROPLASTY Right     SOCIAL HISTORY:   Social History  Substance Use Topics  . Smoking status: Former Games developer  . Smokeless tobacco: Never Used  . Alcohol use No    FAMILY HISTORY:   Family History  Problem Relation Age of Onset  . Heart attack Father     DRUG ALLERGIES:   Allergies  Allergen Reactions  . Penicillins Shortness Of Breath and Other (See Comments)    Per pt "itching, swelling all over body and trouble breathing"  . Doxycycline Calcium Other (See Comments)  . Other     Paper tape- rash  . Sulfa Antibiotics     hives  . Vancomycin Other (See Comments)    Per pt "itching, swelling all over body and trouble breathing"  . Amoxicillin Rash  .  Ciprofloxacin Rash and Other (See Comments)    Skin peeling  . Clindamycin Itching    REVIEW OF SYSTEMS:   Review of Systems  Unable to perform ROS: Acuity of condition  Musculoskeletal: Positive for falls.  Psychiatric/Behavioral: Positive for memory loss and substance abuse.    Review of systems Limited due to patient's confusion  She does state that she fell.  MEDICATIONS AT HOME:   Prior to Admission medications   Medication Sig Start Date End Date Taking? Authorizing Provider  traZODone (DESYREL) 50 MG tablet Take 2 tablets (100 mg total) by mouth at bedtime. 12/30/15  Yes Enedina Finner, MD  venlafaxine (EFFEXOR) 75 MG tablet Take 300 mg by mouth daily. Take 4 caps ( ) daily   Yes Historical Provider, MD  albuterol (PROVENTIL HFA;VENTOLIN HFA) 108 (90 Base) MCG/ACT inhaler Inhale 2 puffs into the lungs every 6 (six) hours as needed.  03/13/14   Historical Provider, MD  budesonide-formoterol (SYMBICORT) 160-4.5 MCG/ACT inhaler Inhale 2 puffs into the lungs 2 (two) times daily.  06/24/14   Historical Provider, MD  Disulfiram 500 MG TABS Take 500 mg by mouth. 04/14/14   Historical Provider, MD  docusate sodium (COLACE) 100 MG capsule Take 100 mg by mouth 2 (two) times daily.  03/13/14   Historical Provider, MD  folic acid (FOLVITE) 400 MCG tablet Take 400  mcg by mouth daily.     Historical Provider, MD  gabapentin (NEURONTIN) 300 MG capsule Take 300 mg by mouth.    Historical Provider, MD  hydrochlorothiazide (HYDRODIURIL) 25 MG tablet Take 12.5 mg by mouth daily.    Historical Provider, MD  lisinopril (PRINIVIL,ZESTRIL) 10 MG tablet Take 10 mg by mouth daily.    Historical Provider, MD  Melatonin 3 MG TABS Take 3 mg by mouth. 06/24/14   Historical Provider, MD  metFORMIN (GLUCOPHAGE) 500 MG tablet Take 500 mg by mouth 2 (two) times daily.    Historical Provider, MD  simvastatin (ZOCOR) 40 MG tablet Take 40 mg by mouth.    Historical Provider, MD  Spacer/Aero-Holding Chambers  (AEROCHAMBER MINI CHAMBER) DEVI Inhale 1 puff into the lungs 2 (two) times daily.  04/13/13   Historical Provider, MD  sulfamethoxazole-trimethoprim (BACTRIM DS,SEPTRA DS) 800-160 MG tablet Take 1 tablet by mouth every 12 (twelve) hours. Patient not taking: Reported on 04/21/2016 12/30/15   Enedina Finner, MD  traMADol (ULTRAM) 50 MG tablet Take 1 tablet (50 mg total) by mouth every 12 (twelve) hours as needed for moderate pain. Patient not taking: Reported on 04/21/2016 12/30/15   Enedina Finner, MD  Vitamin D, Ergocalciferol, (DRISDOL) 50000 units CAPS capsule Take 50,000 Units by mouth every 7 (seven) days.  03/10/14   Historical Provider, MD  zinc sulfate 220 (50 Zn) MG capsule Take 220 mg by mouth.    Historical Provider, MD      VITAL SIGNS:  Blood pressure (!) 133/57, pulse (!) 112, temperature 97.5 F (36.4 C), temperature source Oral, resp. rate (!) 24, SpO2 98 %.  PHYSICAL EXAMINATION:   Physical Exam  Constitutional: She is well-developed, well-nourished, and in no distress. No distress.  Disheveled appearing  HENT:  Head: Normocephalic.  Eyes: No scleral icterus.  Neck: Normal range of motion. Neck supple. No JVD present. No tracheal deviation present.  Cardiovascular: Normal rate, regular rhythm and normal heart sounds.  Exam reveals no gallop and no friction rub.   No murmur heard. Pulmonary/Chest: Effort normal and breath sounds normal. No respiratory distress. She has no wheezes. She has no rales. She exhibits no tenderness.  Abdominal: Soft. Bowel sounds are normal. She exhibits no distension and no mass. There is no tenderness. There is no rebound and no guarding.  Musculoskeletal: Normal range of motion. She exhibits no edema.  Neurological: She is alert.  Oriented to name  Skin: Skin is warm. No rash noted. No erythema.  Psychiatric:  Poor judgment      LABORATORY PANEL:   CBC  Recent Labs Lab 04/21/16 1017  WBC 13.7*  HGB 15.6  HCT 46.6  PLT 174    ------------------------------------------------------------------------------------------------------------------  Chemistries   Recent Labs Lab 04/21/16 1017  NA 135  K 3.7  CL 101  CO2 16*  GLUCOSE 152*  BUN 18  CREATININE 0.84  CALCIUM 8.9  AST 74*  ALT 36  ALKPHOS 88  BILITOT 2.3*   ------------------------------------------------------------------------------------------------------------------  Cardiac Enzymes  Recent Labs Lab 04/21/16 1017  TROPONINI 0.07*   ------------------------------------------------------------------------------------------------------------------  RADIOLOGY:  Ct Head Wo Contrast  Result Date: 04/21/2016 CLINICAL DATA:  Found on floor, altered mental status EXAM: CT HEAD WITHOUT CONTRAST TECHNIQUE: Contiguous axial images were obtained from the base of the skull through the vertex without intravenous contrast. COMPARISON:  None. FINDINGS: Brain: The ventricular system is minimally prominent as are the cortical sulci, indicative of diffuse atrophy. The septum is midline in position. The fourth ventricle  and basilar cisterns are unremarkable. No hemorrhage, mass lesion, or acute infarction is seen. Vascular: No vascular abnormality is noted on this unenhanced study. Skull: On bone window images, no calvarial abnormality is noted. Sinuses/Orbits: The paranasal sinuses are well pneumatized. The orbital rims are intact. Other: A filling defect within the left external auditory canal probably represents cerumen. IMPRESSION: 1. Atrophy.  No acute intracranial abnormality. 2. Probable cerumen within the left external auditory canal. Electronically Signed   By: Dwyane Dee M.D.   On: 04/21/2016 10:56   Dg Pelvis Portable  Result Date: 04/21/2016 CLINICAL DATA:  Bruising.  Pain. EXAM: PORTABLE PELVIS 1-2 VIEWS COMPARISON:  CT 12/28/2015. FINDINGS: Degenerative changes lumbar spine. No acute bony abnormality identified. Bilateral hip replacements. Hardware  intact. IMPRESSION: 1. Bilateral hip replacements. No acute bony abnormality identified. 2. Degenerative changes lumbar spine. Electronically Signed   By: Maisie Fus  Register   On: 04/21/2016 11:21   Dg Chest Port 1 View  Result Date: 04/21/2016 CLINICAL DATA:  Bruising. EXAM: PORTABLE CHEST 1 VIEW COMPARISON:  12/28/2015 . FINDINGS: Mediastinum and hilar structures are normal. Stable cardiomegaly. No focal infiltrate. No pleural effusion or pneumothorax. Old bilateral rib fractures again noted. No acute bony abnormality identified. IMPRESSION: 1. Stable cardiomegaly.  No acute cardiopulmonary disease. 2. Old bilateral rib fractures. No acute abnormality. Chest is stable from prior study of 12/28/2015. Electronically Signed   By: Maisie Fus  Register   On: 04/21/2016 11:19   Dg Knee Complete 4 Views Left  Result Date: 04/21/2016 CLINICAL DATA:  Bruising. EXAM: LEFT KNEE - COMPLETE 4+ VIEW COMPARISON:  12/28/2015. FINDINGS: Total left knee replacement. Hardware intact. Anatomic alignment. Deformity noted the proximal fibula consistent with old fracture. Benign-appearing corticated soft tissue calcifications are noted. IMPRESSION: 1. Total left knee replacement. Hardware intact. Anatomic alignment. No acute bony abnormality identified. 2. Deformity noted of the of proximal fibula, most likely from prior injury. Electronically Signed   By: Maisie Fus  Register   On: 04/21/2016 11:24   Dg Knee Complete 4 Views Right  Result Date: 04/21/2016 CLINICAL DATA:  Bruising. EXAM: RIGHT KNEE - COMPLETE 4+ VIEW COMPARISON:  No recent prior. FINDINGS: Total right knee replacement. Hardware intact. Anatomic alignment. No acute abnormality identified. Corticated soft tissue benign-appearing bony densities noted. No prominent effusion. IMPRESSION: Total right knee replacement. Hardware intact. Anatomic alignment. No acute abnormality identified. Electronically Signed   By: Maisie Fus  Register   On: 04/21/2016 11:27    EKG:   Normal  sinus rhythm and prolonged QTC  IMPRESSION AND PLAN:   66 year old female with a history of EtOH abuse who apparently had a fall about in her permanent 5 days ago and was unable to stand presents with encephalopathy and rhabdomyolysis.  1. Acute rhabdomyolysis due to fall: Continue IV fluids CK in a.m. Monitor BMP  2. Acute encephalopathy with ataxia thought to be possible Warnicke's 500 mg IV thiamine for 3-5 days Neurology consultation CT head as above no acute ICH or CVA  3. Elevated troponin due to demand ischemia Continue telemetry Trend troponin levels  4. Diabetes: Currently not taking prescribed metformin  Start sliding scale insulin with ADA diet Dm coordinator consult Hgba1c  Ordered   5. EtOH abuse: Continue CIWA protocol Check mag and phosphorus  6. COPD without exacerbation: Continue inhaler 7. Hyperlipidemia: Continue statin  8. Essential hypertension: Apparently patient not taking medications Monitor blood pressure Start metoprolol 9. Hyperlipidemia: Continue statin  All the records are reviewed and case discussed with ED provider. Management plans  discussed with the patient and she is in agreement  CODE STATUS: FULL  TOTAL TIME TAKING CARE OF THIS PATIENT: 45 minutes.    Shelby Stewart M.D on 04/21/2016 at 1:27 PM  Between 7am to 6pm - Pager - [phone]  After 6pm go to [email or web address] - Social research officer, government  [facility]  Office  [phone]  CC: Primary care physician; No PCP Per Patient

## 2016-04-21 NOTE — Progress Notes (Signed)
Clinical Child psychotherapist (CSW) received ETOH consult. Per chart patient will be admitted to [facility] under inpatient status. Per MD note patient is confused. CSW will attempt to see patient at a later time.   [employer], LCSW [phone]

## 2016-04-21 NOTE — Progress Notes (Signed)
MEDICATION RELATED CONSULT NOTE - INITIAL   Pharmacy Consult for electrolyte replacement and monitoring Indication:   Allergies  Allergen Reactions  . Penicillins Shortness Of Breath and Other (See Comments)    Per pt "itching, swelling all over body and trouble breathing"  . Doxycycline Calcium Other (See Comments)  . Other     Paper tape- rash  . Sulfa Antibiotics     hives  . Vancomycin Other (See Comments)    Per pt "itching, swelling all over body and trouble breathing"  . Amoxicillin Rash  . Ciprofloxacin Rash and Other (See Comments)    Skin peeling  . Clindamycin Itching    Patient Measurements:   Adjusted Body Weight:   Vital Signs: Temp: 97.5 F (36.4 C) (04/05 1036) Temp Source: Oral (04/05 1036) BP: 138/78 (04/05 1330) Pulse Rate: 102 (04/05 1330) Intake/Output from previous day: No intake/output data recorded. Intake/Output from this shift: No intake/output data recorded.  Labs:  Recent Labs  04/21/16 1017  WBC 13.7*  HGB 15.6  HCT 46.6  PLT 174  CREATININE 0.84  ALBUMIN 3.5  PROT 6.8  AST 74*  ALT 36  ALKPHOS 88  BILITOT 2.3*  BILIDIR 0.5  IBILI 1.8*   CrCl cannot be calculated (Unknown ideal weight.).   Microbiology: No results found for this or any previous visit (from the past 720 hour(s)).  Medical History: Past Medical History:  Diagnosis Date  . Asthma   . COPD (chronic obstructive pulmonary disease) (HCC)   . Diabetes mellitus without complication (HCC)   . Hypertension     Medications:  Infusions:    Assessment: 66 yof cc difficulty walking and confusion, PMH alcohol use disorder, asthma, COPD, DM, HTN. Fell approximately 5 days ago and has had difficulty with ambulating since due to leg pain. Ethyl alcohol <5 mg/dL on admission however multiple bottles of alcohol were found in her apartment according to EMS. Pharmacy consulted to monitor and replace electrolytes.  Goal of Therapy:  Electrolytes WNL  Plan:  K 3.7,  Ca 8.9, albumin 3.5 in ED. No indication for repletion at this time. Will recheck electrolytes tomorrow with AM labs.  Carola Frost, Pharm.D., BCPS Clinical Pharmacist 04/21/2016,1:55 PM

## 2016-04-21 NOTE — ED Notes (Signed)
Pt resting in bed with lights dimmed and eyes closed, occasional cough noted at this time, respirations otherwise even and unlabored. Pt's HR remains tachycardic at 104-123. Skin is noted to be warm, dry, and intact. Will continue to monitor for further patient needs.
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
## 2016-04-21 NOTE — ED Notes (Signed)
Pt visualized in NAD. Pt resting in bed with eyes closed, respirations even and unlabored. Lights noted to be dimmed at this time. Will continue to monitor.

## 2016-04-22 ENCOUNTER — Inpatient Hospital Stay: Payer: Medicare Other

## 2016-04-22 DIAGNOSIS — R4182 Altered mental status, unspecified: Secondary | ICD-10-CM

## 2016-04-22 LAB — GLUCOSE, CAPILLARY
GLUCOSE-CAPILLARY: 166 mg/dL — AB (ref 65–99)
GLUCOSE-CAPILLARY: 152 mg/dL — AB (ref 65–99)
GLUCOSE-CAPILLARY: 160 mg/dL — AB (ref 65–99)
GLUCOSE-CAPILLARY: 164 mg/dL — AB (ref 65–99)

## 2016-04-22 LAB — TROPONIN I: TROPONIN I: 0.05 ng/mL — AB (ref <0.03)

## 2016-04-22 LAB — BASIC METABOLIC PANEL
Anion gap: 9 (ref 5–15)
BUN: 18 mg/dL (ref 6–20)
CO2: 22 mmol/L (ref 22–32)
Calcium: 8.4 mg/dL — ABNORMAL LOW (ref 8.9–10.3)
Chloride: 103 mmol/L (ref 101–111)
Creatinine, Ser: 0.73 mg/dL (ref 0.44–1.00)
GFR calc Af Amer: >60 mL/min (ref >60)
GLUCOSE: 177 mg/dL — AB (ref 65–99)
Potassium: 3.3 mmol/L — ABNORMAL LOW (ref 3.5–5.1)
Sodium: 134 mmol/L — ABNORMAL LOW (ref 135–145)

## 2016-04-22 LAB — CBC
HCT: 40.5 % (ref 35.0–47.0)
Hemoglobin: 13.9 g/dL (ref 12.0–16.0)
MCH: 32 pg (ref 26.0–34.0)
MCHC: 34.4 g/dL (ref 32.0–36.0)
MCV: 93 fL (ref 80.0–100.0)
PLATELETS: 150 10*3/uL (ref 150–440)
RBC: 4.36 MIL/uL (ref 3.80–5.20)
RDW: 16 % — ABNORMAL HIGH (ref 11.5–14.5)
WBC: 9.8 10*3/uL (ref 3.6–11.0)

## 2016-04-22 LAB — MAGNESIUM: Magnesium: 2 mg/dL (ref 1.7–2.4)

## 2016-04-22 LAB — HEMOGLOBIN A1C
Hgb A1c MFr Bld: 8.2 % — ABNORMAL HIGH (ref 4.8–5.6)
MEAN PLASMA GLUCOSE: 189 mg/dL

## 2016-04-22 LAB — PHOSPHORUS: PHOSPHORUS: 1.3 mg/dL — AB (ref 2.5–4.6)

## 2016-04-22 LAB — CK: CK TOTAL: 633 U/L — AB (ref 38–234)

## 2016-04-22 MED ORDER — POTASSIUM PHOSPHATES 15 MMOLE/5ML IV SOLN
20.0000 mmol | Freq: Once | INTRAVENOUS | Status: AC
  Administered 2016-04-22: 09:00:00 20 mmol via INTRAVENOUS
  Filled 2016-04-22: qty 6.67

## 2016-04-22 MED ORDER — INSULIN ASPART 100 UNIT/ML ~~LOC~~ SOLN: 0.0000 [IU] | Freq: Three times a day (TID) | SUBCUTANEOUS | Status: DC

## 2016-04-22 MED ORDER — ENOXAPARIN SODIUM 40 MG/0.4ML ~~LOC~~ SOLN
40.0000 mg | Freq: Two times a day (BID) | SUBCUTANEOUS | Status: DC
  Administered 2016-04-22 – 2016-04-26 (×9): 40 mg via SUBCUTANEOUS
  Filled 2016-04-22 (×9): qty 0.4

## 2016-04-22 MED ORDER — INSULIN ASPART 100 UNIT/ML ~~LOC~~ SOLN: 0.0000 [IU] | Freq: Every day | SUBCUTANEOUS | Status: DC

## 2016-04-22 NOTE — Evaluation (Signed)
Physical Therapy Evaluation Patient Details Name: Shelby Stewart MRN: [id number] DOB: 04/30/1950 Today's Date: 04/22/2016   History of Present Illness  Pt admitted for rhabdomolysis. Pt with complaints of difficulty walking along with confusion secondary to fall x 5 days ago. She reports she has been unable to walk since then. History includes B THR, L TKR, ETOH abuse, falls, COPD, DM, and HTN. Pt with pending neuro consult  Clinical Impression  Pt is a pleasant 66 year old female who was admitted for rhabdomolysis. Pt performs bed mobility, transfers, and ambulation with min assist and RW. Pt is impulsive and tries to get up prior to therapist instruction. Pt demonstrates deficits with endurance/mobility/strength. Pt appears to not be at baseline level, as she fatigues quickly with limited ambulation and needs assist for all mobility. Needs to use RW at this time, baseline uses SPC. Would benefit from skilled PT to address above deficits and promote optimal return to PLOF; recommend transition to STR upon discharge from acute hospitalization. Discussed recommendation with patient, she is currently refusing at this time, however will consider as "back up" plan.       Follow Up Recommendations SNF (pt refusing at this time)    Equipment Recommendations  Rolling walker with 5" wheels    Recommendations for Other Services       Precautions / Restrictions Precautions Precautions: Fall Restrictions Weight Bearing Restrictions: No      Mobility  Bed Mobility Overal bed mobility: Needs Assistance Bed Mobility: Supine to Sit     Supine to sit: Min assist     General bed mobility comments: Pt impulsive and tries to sit up prior to therapist entering room. Assist for sitting at EOB, pt with unsteadiness once seated, however able to self correct  Transfers Overall transfer level: Needs assistance Equipment used: Rolling walker (2 wheeled) Transfers: Sit to/from Stand Sit to Stand: Min  assist         General transfer comment: Pt needs assist for standing, initially without RW, however then transitioned to RW with attempt with improved safety. Wide stance noted  Ambulation/Gait Ambulation/Gait assistance: Min assist Ambulation Distance (Feet): 20 Feet Assistive device: Rolling walker (2 wheeled) Gait Pattern/deviations: Step-to pattern     General Gait Details: ambulated using step to gait pattern and forward flexed posture. Pt fatigues with limited ambulation in room, reports she is unable to further ambulate.   Stairs            Wheelchair Mobility    Modified Rankin (Stroke Patients Only)       Balance Overall balance assessment: Needs assistance;History of Falls Sitting-balance support: Feet supported;No upper extremity supported Sitting balance-Leahy Scale: Good     Standing balance support: Bilateral upper extremity supported Standing balance-Leahy Scale: Fair                               Pertinent Vitals/Pain Pain Assessment: No/denies pain    Home Living Family/patient expects to be discharged to:: Private residence Living Arrangements: Alone   Type of Home: House Home Access: Stairs to enter Entrance Stairs-Rails: Can reach both Entrance Stairs-Number of Steps: 4 Home Layout: Two level;Bed/bath upstairs Home Equipment: Cane - single point (she used to have RW, however gave to mother)      Prior Function Level of Independence: Independent with assistive device(s)         Comments: uses SPC     Hand Dominance  Extremity/Trunk Assessment   Upper Extremity Assessment Upper Extremity Assessment: Overall WFL for tasks assessed    Lower Extremity Assessment Lower Extremity Assessment: Generalized weakness (B LE grossly 3+/5 and fatigues with endurance)       Communication   Communication: No difficulties  Cognition Arousal/Alertness: Awake/alert Behavior During Therapy: WFL for tasks  assessed/performed Overall Cognitive Status: Within Functional Limits for tasks assessed                                        General Comments      Exercises Other Exercises Other Exercises: Pt ambulated to [facility] with cga. Pt impulsive and needs cues for safety. CGA given for hygiene.   Assessment/Plan    PT Assessment Patient needs continued PT services  PT Problem List Decreased strength;Decreased activity tolerance;Decreased balance;Decreased mobility       PT Treatment Interventions Gait training;DME instruction;Therapeutic activities;Therapeutic exercise    PT Goals (Current goals can be found in the Care Plan section)  Acute Rehab PT Goals Patient Stated Goal: to go home PT Goal Formulation: With patient Time For Goal Achievement: 05/06/16 Potential to Achieve Goals: Good    Frequency Min 2X/week   Barriers to discharge Decreased caregiver support;Inaccessible home environment      Co-evaluation               End of Session Equipment Utilized During Treatment: Gait belt Activity Tolerance: Patient tolerated treatment well Patient left: in chair;with chair alarm set Nurse Communication: Mobility status PT Visit Diagnosis: Unsteadiness on feet (R26.81);Muscle weakness (generalized) (M62.81);History of falling (Z91.81)    Time: 1610-9604 PT Time Calculation (min) (ACUTE ONLY): 19 min   Charges:   PT Evaluation $PT Eval Moderate Complexity: 1 Procedure PT Treatments $Therapeutic Activity: 8-22 mins   PT G Codes:        Elizabeth Palau, PT, DPT [phone]   Jamian Andujo 04/22/2016, 2:25 PM

## 2016-04-22 NOTE — Plan of Care (Addendum)
Problem: Education: Goal: Knowledge of [facility] Education information/materials will improve Outcome: Progressing VSS, free of falls during shift.  Received PRN PO Ativan  for CIWA 10 d/t anxiety, nausea, HA.  Pt reports sx improved.  Reported bilateral leg pain 8/10, improved w/ PRN PO Norco .  Reported sore throat, Dr. Anne Hahn paged, pt received ordered PRN phenol lozenge x1.  No other needs overnight.  Assist x1 to [facility] during shift, pt tolerated well.  Bed in low position, call bell within reach.  WCTM.

## 2016-04-22 NOTE — Progress Notes (Signed)
[employer] [facility] [place] at [facility]   PATIENT NAME: Shelby Stewart    MR#:  [id number]  DATE OF BIRTH:  1950-07-29  SUBJECTIVE:  CHIEF COMPLAINT:   Chief Complaint  Patient presents with  . Altered Mental Status   Patient is awake and alert. States she tripped over her dog and fell. Has not had any alcohol in 1 week.  REVIEW OF SYSTEMS:    Review of Systems  Constitutional: Positive for malaise/fatigue. Negative for chills and fever.  HENT: Negative for sore throat.   Eyes: Negative for blurred vision, double vision and pain.  Respiratory: Negative for cough, hemoptysis, shortness of breath and wheezing.   Cardiovascular: Negative for chest pain, palpitations, orthopnea and leg swelling.  Gastrointestinal: Negative for abdominal pain, constipation, diarrhea, heartburn, nausea and vomiting.  Genitourinary: Negative for dysuria and hematuria.  Musculoskeletal: Positive for falls. Negative for back pain and joint pain.  Skin: Negative for rash.  Neurological: Positive for weakness. Negative for sensory change, speech change, focal weakness and headaches.  Endo/Heme/Allergies: Does not bruise/bleed easily.  Psychiatric/Behavioral: Negative for depression. The patient is not nervous/anxious.     DRUG ALLERGIES:   Allergies  Allergen Reactions  . Penicillins Shortness Of Breath and Other (See Comments)    Per pt "itching, swelling all over body and trouble breathing"  . Doxycycline Calcium Other (See Comments)  . Other     Paper tape- rash  . Sulfa Antibiotics     hives  . Vancomycin Other (See Comments)    Per pt "itching, swelling all over body and trouble breathing"  . Amoxicillin Rash  . Ciprofloxacin Rash and Other (See Comments)    Skin peeling  . Clindamycin Itching    VITALS:  Blood pressure (!) 102/48, pulse 94, temperature 97.8 F (36.6 C), temperature source Oral, resp. rate 20, height 5' (1.524 m), weight 96.6 kg (213 lb), SpO2 93  %.  PHYSICAL EXAMINATION:   Physical Exam  GENERAL:  66 y.o.-year-old patient lying in the bed with no acute distress.  EYES: Pupils equal, round, reactive to light and accommodation. No scleral icterus. Extraocular muscles intact.  HEENT: Head atraumatic, normocephalic. Oropharynx and nasopharynx clear.  NECK:  Supple, no jugular venous distention. No thyroid enlargement, no tenderness.  LUNGS: Normal breath sounds bilaterally, no wheezing, rales, rhonchi. No use of accessory muscles of respiration.  CARDIOVASCULAR: S1, S2 normal. No murmurs, rubs, or gallops.  ABDOMEN: Soft, nontender, nondistended. Bowel sounds present. No organomegaly or mass.  EXTREMITIES: No cyanosis, clubbing or edema b/l.    NEUROLOGIC: Cranial nerves II through XII are intact. No focal Motor or sensory deficits b/l.   PSYCHIATRIC: The patient is alert and awake SKIN: No obvious rash, lesion, or ulcer.   LABORATORY PANEL:   CBC  Recent Labs Lab 04/22/16 0445  WBC 9.8  HGB 13.9  HCT 40.5  PLT 150   ------------------------------------------------------------------------------------------------------------------ Chemistries   Recent Labs Lab 04/21/16 1017  04/22/16 0445  NA 135  --  134*  K 3.7  --  3.3*  CL 101  --  103  CO2 16*  --  22  GLUCOSE 152*  --  177*  BUN 18  --  18  CREATININE 0.84  < > 0.73  CALCIUM 8.9  --  8.4*  MG  --   < > 2.0  AST 74*  --   --   ALT 36  --   --   ALKPHOS 88  --   --  BILITOT 2.3*  --   --   < > = values in this interval not displayed. ------------------------------------------------------------------------------------------------------------------  Cardiac Enzymes  Recent Labs Lab 04/22/16 0445  TROPONINI <0.03   ------------------------------------------------------------------------------------------------------------------  RADIOLOGY:  Ct Head Wo Contrast  Result Date: 04/21/2016 CLINICAL DATA:  Found on floor, altered mental status EXAM: CT  HEAD WITHOUT CONTRAST TECHNIQUE: Contiguous axial images were obtained from the base of the skull through the vertex without intravenous contrast. COMPARISON:  None. FINDINGS: Brain: The ventricular system is minimally prominent as are the cortical sulci, indicative of diffuse atrophy. The septum is midline in position. The fourth ventricle and basilar cisterns are unremarkable. No hemorrhage, mass lesion, or acute infarction is seen. Vascular: No vascular abnormality is noted on this unenhanced study. Skull: On bone window images, no calvarial abnormality is noted. Sinuses/Orbits: The paranasal sinuses are well pneumatized. The orbital rims are intact. Other: A filling defect within the left external auditory canal probably represents cerumen. IMPRESSION: 1. Atrophy.  No acute intracranial abnormality. 2. Probable cerumen within the left external auditory canal. Electronically Signed   By: Dwyane Dee M.D.   On: 04/21/2016 10:56   Mr Brain Wo Contrast  Result Date: 04/22/2016 CLINICAL DATA:  Left lower extremity weakness EXAM: MRI HEAD WITHOUT CONTRAST TECHNIQUE: Multiplanar, multiecho pulse sequences of the brain and surrounding structures were obtained without intravenous contrast. COMPARISON:  Head CT 04/21/2016 FINDINGS: Brain: No focal diffusion restriction to indicate acute infarct. No intraparenchymal hemorrhage. There is mild multifocal hyperintense T2-weighted signal within the periventricular white matter, most often seen in the setting of chronic microvascular ischemia. No mass lesion or midline shift. No hydrocephalus or extra-axial fluid collection. There is a small pineal gland cyst. No lobar predominant atrophy. Vascular: Major intracranial arterial and venous sinus flow voids are preserved. No evidence of chronic microhemorrhage or amyloid angiopathy. Skull and upper cervical spine: The visualized skull base, calvarium, upper cervical spine and extracranial soft tissues are normal.  Sinuses/Orbits: No fluid levels or advanced mucosal thickening. No mastoid effusion. Normal orbits. IMPRESSION: Mild atrophy and chronic microvascular ischemia without acute intracranial abnormality. Electronically Signed   By: Deatra Robinson M.D.   On: 04/22/2016 14:46   Dg Pelvis Portable  Result Date: 04/21/2016 CLINICAL DATA:  Bruising.  Pain. EXAM: PORTABLE PELVIS 1-2 VIEWS COMPARISON:  CT 12/28/2015. FINDINGS: Degenerative changes lumbar spine. No acute bony abnormality identified. Bilateral hip replacements. Hardware intact. IMPRESSION: 1. Bilateral hip replacements. No acute bony abnormality identified. 2. Degenerative changes lumbar spine. Electronically Signed   By: Maisie Fus  Register   On: 04/21/2016 11:21   Dg Chest Port 1 View  Result Date: 04/21/2016 CLINICAL DATA:  Bruising. EXAM: PORTABLE CHEST 1 VIEW COMPARISON:  12/28/2015 . FINDINGS: Mediastinum and hilar structures are normal. Stable cardiomegaly. No focal infiltrate. No pleural effusion or pneumothorax. Old bilateral rib fractures again noted. No acute bony abnormality identified. IMPRESSION: 1. Stable cardiomegaly.  No acute cardiopulmonary disease. 2. Old bilateral rib fractures. No acute abnormality. Chest is stable from prior study of 12/28/2015. Electronically Signed   By: Maisie Fus  Register   On: 04/21/2016 11:19   Dg Knee Complete 4 Views Left  Result Date: 04/21/2016 CLINICAL DATA:  Bruising. EXAM: LEFT KNEE - COMPLETE 4+ VIEW COMPARISON:  12/28/2015. FINDINGS: Total left knee replacement. Hardware intact. Anatomic alignment. Deformity noted the proximal fibula consistent with old fracture. Benign-appearing corticated soft tissue calcifications are noted. IMPRESSION: 1. Total left knee replacement. Hardware intact. Anatomic alignment. No acute bony abnormality identified. 2. Deformity  noted of the of proximal fibula, most likely from prior injury. Electronically Signed   By: Maisie Fus  Register   On: 04/21/2016 11:24   Dg Knee  Complete 4 Views Right  Result Date: 04/21/2016 CLINICAL DATA:  Bruising. EXAM: RIGHT KNEE - COMPLETE 4+ VIEW COMPARISON:  No recent prior. FINDINGS: Total right knee replacement. Hardware intact. Anatomic alignment. No acute abnormality identified. Corticated soft tissue benign-appearing bony densities noted. No prominent effusion. IMPRESSION: Total right knee replacement. Hardware intact. Anatomic alignment. No acute abnormality identified. Electronically Signed   By: Maisie Fus  Register   On: 04/21/2016 11:27     ASSESSMENT AND PLAN:   66 year old female with a history of EtOH abuse who apparently had a fall about in her permanent 5 days ago and was unable to stand presents with encephalopathy and rhabdomyolysis.  * Acute rhabdomyolysis due to fall Continue IV fluids CK in a.m. Monitor BMP  * Acute encephalopathy with ataxia - Improving 500 mg IV thiamine for 3-5 days Neurology consultation. Discussed with neurology Dr. Thad Ranger CT head - no acute ICH or CVA  * Elevated troponin due to rhabdomyolysis Continue telemetry Trending down along with CK  * Diabetes: Currently not taking prescribed metformin  Start sliding scale insulin Hgba1c  * EtOH abuse: Continue CIWA protocol  * COPD without exacerbation: Continue inhalers  * Essential hypertension metoprolol  All the records are reviewed and case discussed with Care Management/Social Worker Management plans discussed with the patient, family and they are in agreement.  CODE STATUS: FULL CODE  DVT Prophylaxis: SCDs  TOTAL TIME TAKING CARE OF THIS PATIENT: 35 minutes.   POSSIBLE D/C IN 1-2 DAYS, DEPENDING ON CLINICAL CONDITION.  Milagros Loll R M.D on 04/22/2016 at 3:38 PM  Between 7am to 6pm - Pager - [phone]  After 6pm go to [email or web address] - password [email or web address] [facility]  [facility] [place] [facility]  Office  [phone]  CC: Primary care physician; No PCP Per Patient  Note: This dictation was prepared with  Dragon dictation along with smaller phrase technology. Any transcriptional errors that result from this process are unintentional.

## 2016-04-22 NOTE — Progress Notes (Signed)
Order for enoxaparin 40 mg subcutaneously daily changed to 40 mg subQ BID per anticoagulation protocol for BMI > 40 and CrCl > 30 mL/min.  Cindi Carbon, PharmD 04/22/16 7:32 AM

## 2016-04-22 NOTE — Progress Notes (Signed)
MEDICATION RELATED CONSULT NOTE - INITIAL   Pharmacy Consult for electrolyte replacement and monitoring Indication: hypophosphatemia  Allergies  Allergen Reactions  . Penicillins Shortness Of Breath and Other (See Comments)    Per pt "itching, swelling all over body and trouble breathing"  . Doxycycline Calcium Other (See Comments)  . Other     Paper tape- rash  . Sulfa Antibiotics     hives  . Vancomycin Other (See Comments)    Per pt "itching, swelling all over body and trouble breathing"  . Amoxicillin Rash  . Ciprofloxacin Rash and Other (See Comments)    Skin peeling  . Clindamycin Itching   Patient Measurements: Height: 5' (152.4 cm) Weight: 213 lb (96.6 kg) IBW/kg (Calculated) : 45.5  Vital Signs: Temp: 97.8 F (36.6 C) (04/06 0442) Temp Source: Oral (04/06 0442) BP: 102/48 (04/06 0442) Pulse Rate: 94 (04/06 0442) Intake/Output from previous day: 04/05 0701 - 04/06 0700 In: 1201 [P.O.:240; I.V.:861; IV Piggyback:100] Out: 300 [Urine:300] Intake/Output from this shift: No intake/output data recorded.  Labs:  Recent Labs  04/21/16 1017 04/21/16 1733 04/22/16 0445  WBC 13.7* 13.9* 9.8  HGB 15.6 16.2* 13.9  HCT 46.6 48.0* 40.5  PLT 174 111* 150  CREATININE 0.84 0.86 0.73  MG  --  2.2 2.0  PHOS  --  1.3* 1.3*  ALBUMIN 3.5  --   --   PROT 6.8  --   --   AST 74*  --   --   ALT 36  --   --   ALKPHOS 88  --   --   BILITOT 2.3*  --   --   BILIDIR 0.5  --   --   IBILI 1.8*  --   --    Estimated Creatinine Clearance: 70 mL/min (by C-G formula based on SCr of 0.73 mg/dL).   Microbiology: No results found for this or any previous visit (from the past 720 hour(s)).  Medical History: Past Medical History:  Diagnosis Date  . Asthma   . COPD (chronic obstructive pulmonary disease) (HCC)   . Diabetes mellitus without complication (HCC)   . Hypertension     Medications:  Infusions:  . sodium chloride 75 mL/hr at 04/21/16 1752    Assessment: 66 yof  cc difficulty walking and confusion, PMH alcohol use disorder, asthma, COPD, DM, HTN. Fell approximately 5 days ago and has had difficulty with ambulating since due to leg pain. Ethyl alcohol <5 mg/dL on admission however multiple bottles of alcohol were found in her apartment according to EMS. Pharmacy consulted to monitor and replace electrolytes.  Goal of Therapy:  Electrolytes WNL  Plan:  K = 3.3, Mg = 2, Phos = 1.3  Will order potassium phosphate 20 mmol IV x 1 dose and recheck electrolytes with AM labs tomorrow.  Cindi Carbon, Pharm.D., BCPS Clinical Pharmacist 04/22/2016,7:26 AM

## 2016-04-22 NOTE — Care Management (Signed)
Admitted to this facility with the diagnosis of rhabdomyolysis. Lives alone at [facility] on [address]. Last seen Dr. Bradd Canary at [facility] 4 months ago. Sister is Rodrigo Ran [phone]). Home Health per [facility] 4 years ago and [facility] in December 2017. No skilled facility, Home oxygen per [facility] x 1 year. Uses oxygen prn. Takes care of all basic activities of daily living herself, doesn't drive. Friends help with errands. Decreased appetite since fall. Prescriptions are filled at [facility] in [place]. Would need to call friends for transportation home.  CIWA protocol. Gwenette Greet RN MSN CCM Care Management [phone]
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
## 2016-04-22 NOTE — Discharge Instructions (Signed)
QUIT ALCOHOL/BEER  Resume diet as before

## 2016-04-22 NOTE — Progress Notes (Addendum)
Inpatient Diabetes Program Recommendations  AACE/ADA: New Consensus Statement on Inpatient Glycemic Control (2015)  Target Ranges:  Prepandial:   less than 140 mg/dL      Peak postprandial:   less than 180 mg/dL (1-2 hours)      Critically ill patients:  140 - 180 mg/dL   Lab Results  Component Value Date   GLUCAP 166 (H) 04/22/2016   HGBA1C 8.2 (H) 04/21/2016    Review of Glycemic Control  Results for YAMARIS, CUMMINGS (MRN [id number]) as of 04/22/2016 10:03  Ref. Range 04/21/2016 17:35 04/21/2016 21:05 04/22/2016 07:26  Glucose-Capillary Latest Ref Range: 65 - 99 mg/dL 409 (H) 811 (H) 914 (H)    Diabetes history: Type 2 Outpatient Diabetes medications: Metformin   Current orders for Inpatient glycemic control: Novolog 0-9 units tid  Inpatient Diabetes Program Recommendations:  Recommend adding Novolog 0-5 units qhs.   Spoke to patient at the bedside- tells me she checks blood sugar q2-3 days fasting only.  Does not take them to her to her doctor and MD has not requested a report.  Reports fasting sugars are 120-130mg /dl- does not check evening blood sugars.   Susette Racer, RN, BA, MHA, CDE Diabetes Coordinator Inpatient Diabetes Program  [phone] (Team Pager) [phone] [facility] Office) 04/22/2016 10:08 AM

## 2016-04-23 ENCOUNTER — Inpatient Hospital Stay: Payer: Medicare Other

## 2016-04-23 LAB — COMPREHENSIVE METABOLIC PANEL
ALBUMIN: 3.1 g/dL — AB (ref 3.5–5.0)
ALK PHOS: 76 U/L (ref 38–126)
ALT: 27 U/L (ref 14–54)
ANION GAP: 4 — AB (ref 5–15)
AST: 39 U/L (ref 15–41)
BILIRUBIN TOTAL: 1.3 mg/dL — AB (ref 0.3–1.2)
BUN: 14 mg/dL (ref 6–20)
CHLORIDE: 105 mmol/L (ref 101–111)
CO2: 26 mmol/L (ref 22–32)
CREATININE: 0.64 mg/dL (ref 0.44–1.00)
Calcium: 8.5 mg/dL — ABNORMAL LOW (ref 8.9–10.3)
GFR calc non Af Amer: >60 mL/min (ref >60)
GLUCOSE: 182 mg/dL — AB (ref 65–99)
Potassium: 3.2 mmol/L — ABNORMAL LOW (ref 3.5–5.1)
SODIUM: 135 mmol/L (ref 135–145)
Total Protein: 5.5 g/dL — ABNORMAL LOW (ref 6.5–8.1)

## 2016-04-23 LAB — PHOSPHORUS
PHOSPHORUS: 1.7 mg/dL — AB (ref 2.5–4.6)
Phosphorus: 3.7 mg/dL (ref 2.5–4.6)

## 2016-04-23 LAB — GLUCOSE, CAPILLARY
GLUCOSE-CAPILLARY: 157 mg/dL — AB (ref 65–99)
GLUCOSE-CAPILLARY: 230 mg/dL — AB (ref 65–99)
GLUCOSE-CAPILLARY: 233 mg/dL — AB (ref 65–99)
GLUCOSE-CAPILLARY: 169 mg/dL — AB (ref 65–99)

## 2016-04-23 LAB — MAGNESIUM: Magnesium: 1.7 mg/dL (ref 1.7–2.4)

## 2016-04-23 LAB — POTASSIUM: Potassium: 4.5 mmol/L (ref 3.5–5.1)

## 2016-04-23 LAB — CK: Total CK: 406 U/L — ABNORMAL HIGH (ref 38–234)

## 2016-04-23 MED ORDER — METFORMIN HCL 850 MG PO TABS
850.0000 mg | ORAL_TABLET | Freq: Two times a day (BID) | ORAL | Status: DC
  Administered 2016-04-23 – 2016-04-26 (×5): 850 mg via ORAL
  Filled 2016-04-23 (×6): qty 1

## 2016-04-23 MED ORDER — POTASSIUM PHOSPHATES 15 MMOLE/5ML IV SOLN
30.0000 mmol | Freq: Once | INTRAVENOUS | Status: AC
  Administered 2016-04-23: 30 mmol via INTRAVENOUS
  Filled 2016-04-23: qty 10

## 2016-04-23 MED ORDER — VITAMIN B-1 100 MG PO TABS
100.0000 mg | ORAL_TABLET | Freq: Every day | ORAL | Status: DC
  Administered 2016-04-23 – 2016-04-26 (×4): 100 mg via ORAL
  Filled 2016-04-23 (×4): qty 1

## 2016-04-23 MED ORDER — POTASSIUM CHLORIDE CRYS ER 10 MEQ PO TBCR: 10.0000 meq | EXTENDED_RELEASE_TABLET | Freq: Every day | ORAL | 0 refills | Status: DC

## 2016-04-23 MED ORDER — POTASSIUM CHLORIDE CRYS ER 20 MEQ PO TBCR
40.0000 meq | EXTENDED_RELEASE_TABLET | Freq: Once | ORAL | Status: AC
  Administered 2016-04-23: 09:00:00 40 meq via ORAL
  Filled 2016-04-23: qty 2

## 2016-04-23 NOTE — Clinical Social Work Note (Addendum)
Clinical Social Work Assessment  Patient Details  Name: Shelby Stewart MRN: [id number] Date of Birth: 06-04-1950  Date of referral:  04/23/16               Reason for consult:  Substance Use/ETOH Abuse, Facility Placement                Permission sought to share information with:  Chartered certified accountant granted to share information::  Yes, Verbal Permission Granted  Name::        Agency::     Relationship::     Contact Information:     Housing/Transportation Living arrangements for the past 2 months:  Single Family Home Source of Information:  Patient Patient Interpreter Needed:  None Criminal Activity/Legal Involvement Pertinent to Current Situation/Hospitalization:  No - Comment as needed Significant Relationships:  Siblings Lives with:  Self Do you feel safe going back to the place where you live?  No (Patient feels unsafe with physical limitations. No abuse.) Need for family participation in patient care:  No (Coment)  Care giving concerns:  ETOH abuse/PT rec for STR   Social Worker assessment / plan:  CSW met with patient at bedside to discuss dc planning and ETOH abuse. The patient refused resources for outpatient substance use services. The patient gave verbal permission to conduct a SNF referral and named [facility] as a definite "no". CSW explained Medicare guidelines for coverage.   The patient uses a cane at baseline and is independent in ADLs and IADLs except for driving. The patient reports no ETOH use in the past 7 days. The patient has a pet at home which she tripped over. The patient indicates that she has few friends and that her sister is her main support.  The patient has a Level II PASSR. The CSW will have the MD sign a 30-day note and send it with the H&P to [place].   Employment status:  Retired Forensic scientist:  [employer] PT Recommendations:  [place] / Referral to community resources:   [place]  Patient/Family's Response to care:  The patient thanked the CSW for assistance.  Patient/Family's Understanding of and Emotional Response to Diagnosis, Current Treatment, and Prognosis:  The patient understands and agrees with dc plan to SNF. The patient has limited insight into her ETOH abuse and refused services.  Emotional Assessment Appearance:  Appears stated age Attitude/Demeanor/Rapport:  Apprehensive Affect (typically observed):  Apprehensive, Pleasant Orientation:  Oriented to Self, Oriented to Place, Oriented to  Time, Oriented to Situation Alcohol / Substance use:  Alcohol Use (Patient on CIWA. Refused resources.) Psych involvement (Current and /or in the community):  No (Comment)  Discharge Needs  Concerns to be addressed:  Care Coordination, Discharge Planning Concerns Readmission within the last 30 days:  No Current discharge risk:  Substance Abuse Barriers to Discharge:  Continued Medical Work up   [employer], LCSW 04/23/2016, 11:43 AM

## 2016-04-23 NOTE — Progress Notes (Signed)
MEDICATION RELATED CONSULT NOTE - FOLLOW UP   Pharmacy Consult for electrolyte replacement and monitoring Indication: hypophosphatemia  Allergies  Allergen Reactions  . Penicillins Shortness Of Breath and Other (See Comments)    Per pt "itching, swelling all over body and trouble breathing"  . Doxycycline Calcium Other (See Comments)  . Other     Paper tape- rash  . Sulfa Antibiotics     hives  . Vancomycin Other (See Comments)    Per pt "itching, swelling all over body and trouble breathing"  . Amoxicillin Rash  . Ciprofloxacin Rash and Other (See Comments)    Skin peeling  . Clindamycin Itching   Patient Measurements: Height: 5' (152.4 cm) Weight: 213 lb (96.6 kg) IBW/kg (Calculated) : 45.5  Vital Signs: Temp: 98 F (36.7 C) (04/07 0413) Temp Source: Oral (04/07 0413) BP: 119/52 (04/07 0413) Pulse Rate: 77 (04/07 0413) Intake/Output from previous day: 04/06 0701 - 04/07 0700 In: 1801.3 [P.O.:720; I.V.:981.3; IV Piggyback:100] Out: 1100 [Urine:1100] Intake/Output from this shift: Total I/O In: 240 [P.O.:240] Out: -   Labs:  Recent Labs  04/21/16 1017 04/21/16 1733 04/22/16 0445 04/23/16 0636  WBC 13.7* 13.9* 9.8  --   HGB 15.6 16.2* 13.9  --   HCT 46.6 48.0* 40.5  --   PLT 174 111* 150  --   CREATININE 0.84 0.86 0.73 0.64  MG  --  2.2 2.0 1.7  PHOS  --  1.3* 1.3* 1.7*  ALBUMIN 3.5  --   --  3.1*  PROT 6.8  --   --  5.5*  AST 74*  --   --  39  ALT 36  --   --  27  ALKPHOS 88  --   --  76  BILITOT 2.3*  --   --  1.3*  BILIDIR 0.5  --   --   --   IBILI 1.8*  --   --   --    Estimated Creatinine Clearance: 70 mL/min (by C-G formula based on SCr of 0.64 mg/dL).   Microbiology: No results found for this or any previous visit (from the past 720 hour(s)).  Medical History: Past Medical History:  Diagnosis Date  . Asthma   . COPD (chronic obstructive pulmonary disease) (HCC)   . Diabetes mellitus without complication (HCC)   . Hypertension      Medications:  Infusions:    Assessment: 66 yof cc difficulty walking and confusion, PMH alcohol use disorder, asthma, COPD, DM, HTN. Fell approximately 5 days ago and has had difficulty with ambulating since due to leg pain. Ethyl alcohol <5 mg/dL on admission however multiple bottles of alcohol were found in her apartment according to EMS. Pharmacy consulted to monitor and replace electrolytes.  Goal of Therapy:  Electrolytes WNL  Plan:  K = 3.2, Mg = 1.7, Phos = 1.7  MD Sudini ordered KPhos 30 mmol IV x 1. Will recheck Phos and K @ 18:00.   Helana Macbride D, Pharm.D., BCPS Clinical Pharmacist 04/23/2016,10:55 AM

## 2016-04-23 NOTE — Progress Notes (Signed)
Subjective: Patient remains alert and awake on fall precautions.    Objective: Current vital signs: BP (!) 119/52 (BP Location: Left Arm) Comment: RN Sarah notified  Pulse 77   Temp 98 F (36.7 C) (Oral)   Resp 18   Ht 5' (1.524 m)   Wt 96.6 kg (213 lb)   SpO2 96%   BMI 41.60 kg/m  Vital signs in last 24 hours: Temp:  [98 F (36.7 C)-98.9 F (37.2 C)] 98 F (36.7 C) (04/07 0413) Pulse Rate:  [77-90] 77 (04/07 0413) Resp:  [18] 18 (04/07 0413) BP: (119-139)/(52-82) 119/52 (04/07 0413) SpO2:  [94 %-97 %] 96 % (04/07 0413)  Intake/Output from previous day: 04/06 0701 - 04/07 0700 In: 1801.3 [P.O.:720; I.V.:981.3; IV Piggyback:100] Out: 1100 [Urine:1100] Intake/Output this shift: Total I/O In: 240 [P.O.:240] Out: -  Nutritional status: Diet regular Room service appropriate? Yes; Fluid consistency: Thin  Neurologic Exam: Mental Status: Alert.  Speech fluent without evidence of aphasia.  Able to follow 3 step commands without difficulty. Cranial Nerves: II: Discs flat bilaterally; Visual fields grossly normal, pupils equal, round, reactive to light and accommodation III,IV, VI: ptosis not present, extra-ocular motions intact bilaterally V,VII: smile symmetric, facial light touch sensation normal bilaterally VIII: hearing normal bilaterally IX,X: gag reflex present XI: bilateral shoulder shrug XII: midline tongue extension Motor: Right :  Upper extremity   5-/5                                     Left:     Upper extremity   5-/5             Lower extremity   5-/5                                                 Lower extremity   5-/5, with left foot drop Tone and bulk:normal tone throughout; no atrophy noted Sensory: Pinprick and light touch decreased in the LUE Deep Tendon Reflexes: 2+ in the upper extremities, absent in the lower extremities   Lab Results: Basic Metabolic Panel:  Recent Labs Lab 04/21/16 1017 04/21/16 1733 04/22/16 0445 04/23/16 0636  NA 135   --  134* 135  K 3.7  --  3.3* 3.2*  CL 101  --  103 105  CO2 16*  --  22 26  GLUCOSE 152*  --  177* 182*  BUN 18  --  18 14  CREATININE 0.84 0.86 0.73 0.64  CALCIUM 8.9  --  8.4* 8.5*  MG  --  2.2 2.0 1.7  PHOS  --  1.3* 1.3* 1.7*    Liver Function Tests:  Recent Labs Lab 04/21/16 1017 04/23/16 0636  AST 74* 39  ALT 36 27  ALKPHOS 88 76  BILITOT 2.3* 1.3*  PROT 6.8 5.5*  ALBUMIN 3.5 3.1*    Recent Labs Lab 04/21/16 1017  LIPASE 51    Recent Labs Lab 04/21/16 1017  AMMONIA 28    CBC:  Recent Labs Lab 04/21/16 1017 04/21/16 1733 04/22/16 0445  WBC 13.7* 13.9* 9.8  NEUTROABS 10.8*  --   --   HGB 15.6 16.2* 13.9  HCT 46.6 48.0* 40.5  MCV 92.9 93.4 93.0  PLT 174 111* 150    Cardiac Enzymes:  Recent  Labs Lab 04/21/16 1017 04/21/16 1733 04/21/16 2300 04/22/16 0445 04/23/16 0636  CKTOTAL 1,303*  --   --  633* 406*  TROPONINI 0.07* 0.04* 0.05* <0.03  --     Lipid Panel: No results for input(s): CHOL, TRIG, HDL, CHOLHDL, VLDL, LDLCALC in the last 168 hours.  CBG:  Recent Labs Lab 04/22/16 0726 04/22/16 1137 04/22/16 1737 04/22/16 2112 04/23/16 0748  GLUCAP 166* 152* 160* 164* 157*    Microbiology: Results for orders placed or performed during the hospital encounter of 12/28/15  Urine culture     Status: Abnormal   Collection Time: 12/28/15  2:33 PM  Result Value Ref Range Status   Specimen Description URINE, RANDOM  Final   Special Requests NONE  Final   Culture >=100,000 COLONIES/mL ENTEROBACTER SPECIES (A)  Final   Report Status 12/30/2015 FINAL  Final   Organism ID, Bacteria ENTEROBACTER SPECIES (A)  Final      Susceptibility   Enterobacter species - MIC*    CEFAZOLIN >=64 RESISTANT Resistant     CEFTRIAXONE <=1 SENSITIVE Sensitive     CIPROFLOXACIN <=0.25 SENSITIVE Sensitive     GENTAMICIN <=1 SENSITIVE Sensitive     IMIPENEM 2 SENSITIVE Sensitive     NITROFURANTOIN 32 SENSITIVE Sensitive     TRIMETH/SULFA <=20 SENSITIVE  Sensitive     PIP/TAZO <=4 SENSITIVE Sensitive     * >=100,000 COLONIES/mL ENTEROBACTER SPECIES    Coagulation Studies:  Recent Labs  04/21/16 1017  LABPROT 14.9  INR 1.16    Imaging: Ct Head Wo Contrast  Result Date: 04/21/2016 CLINICAL DATA:  Found on floor, altered mental status EXAM: CT HEAD WITHOUT CONTRAST TECHNIQUE: Contiguous axial images were obtained from the base of the skull through the vertex without intravenous contrast. COMPARISON:  None. FINDINGS: Brain: The ventricular system is minimally prominent as are the cortical sulci, indicative of diffuse atrophy. The septum is midline in position. The fourth ventricle and basilar cisterns are unremarkable. No hemorrhage, mass lesion, or acute infarction is seen. Vascular: No vascular abnormality is noted on this unenhanced study. Skull: On bone window images, no calvarial abnormality is noted. Sinuses/Orbits: The paranasal sinuses are well pneumatized. The orbital rims are intact. Other: A filling defect within the left external auditory canal probably represents cerumen. IMPRESSION: 1. Atrophy.  No acute intracranial abnormality. 2. Probable cerumen within the left external auditory canal. Electronically Signed   By: Dwyane Dee M.D.   On: 04/21/2016 10:56   Mr Brain Wo Contrast  Result Date: 04/22/2016 CLINICAL DATA:  Left lower extremity weakness EXAM: MRI HEAD WITHOUT CONTRAST TECHNIQUE: Multiplanar, multiecho pulse sequences of the brain and surrounding structures were obtained without intravenous contrast. COMPARISON:  Head CT 04/21/2016 FINDINGS: Brain: No focal diffusion restriction to indicate acute infarct. No intraparenchymal hemorrhage. There is mild multifocal hyperintense T2-weighted signal within the periventricular white matter, most often seen in the setting of chronic microvascular ischemia. No mass lesion or midline shift. No hydrocephalus or extra-axial fluid collection. There is a small pineal gland cyst. No lobar  predominant atrophy. Vascular: Major intracranial arterial and venous sinus flow voids are preserved. No evidence of chronic microhemorrhage or amyloid angiopathy. Skull and upper cervical spine: The visualized skull base, calvarium, upper cervical spine and extracranial soft tissues are normal. Sinuses/Orbits: No fluid levels or advanced mucosal thickening. No mastoid effusion. Normal orbits. IMPRESSION: Mild atrophy and chronic microvascular ischemia without acute intracranial abnormality. Electronically Signed   By: Deatra Robinson M.D.   On: 04/22/2016 14:46  Dg Pelvis Portable  Result Date: 04/21/2016 CLINICAL DATA:  Bruising.  Pain. EXAM: PORTABLE PELVIS 1-2 VIEWS COMPARISON:  CT 12/28/2015. FINDINGS: Degenerative changes lumbar spine. No acute bony abnormality identified. Bilateral hip replacements. Hardware intact. IMPRESSION: 1. Bilateral hip replacements. No acute bony abnormality identified. 2. Degenerative changes lumbar spine. Electronically Signed   By: Maisie Fus  Register   On: 04/21/2016 11:21   Dg Chest Port 1 View  Result Date: 04/21/2016 CLINICAL DATA:  Bruising. EXAM: PORTABLE CHEST 1 VIEW COMPARISON:  12/28/2015 . FINDINGS: Mediastinum and hilar structures are normal. Stable cardiomegaly. No focal infiltrate. No pleural effusion or pneumothorax. Old bilateral rib fractures again noted. No acute bony abnormality identified. IMPRESSION: 1. Stable cardiomegaly.  No acute cardiopulmonary disease. 2. Old bilateral rib fractures. No acute abnormality. Chest is stable from prior study of 12/28/2015. Electronically Signed   By: Maisie Fus  Register   On: 04/21/2016 11:19   Dg Knee Complete 4 Views Left  Result Date: 04/21/2016 CLINICAL DATA:  Bruising. EXAM: LEFT KNEE - COMPLETE 4+ VIEW COMPARISON:  12/28/2015. FINDINGS: Total left knee replacement. Hardware intact. Anatomic alignment. Deformity noted the proximal fibula consistent with old fracture. Benign-appearing corticated soft tissue  calcifications are noted. IMPRESSION: 1. Total left knee replacement. Hardware intact. Anatomic alignment. No acute bony abnormality identified. 2. Deformity noted of the of proximal fibula, most likely from prior injury. Electronically Signed   By: Maisie Fus  Register   On: 04/21/2016 11:24   Dg Knee Complete 4 Views Right  Result Date: 04/21/2016 CLINICAL DATA:  Bruising. EXAM: RIGHT KNEE - COMPLETE 4+ VIEW COMPARISON:  No recent prior. FINDINGS: Total right knee replacement. Hardware intact. Anatomic alignment. No acute abnormality identified. Corticated soft tissue benign-appearing bony densities noted. No prominent effusion. IMPRESSION: Total right knee replacement. Hardware intact. Anatomic alignment. No acute abnormality identified. Electronically Signed   By: Maisie Fus  Register   On: 04/21/2016 11:27    Medications:  I have reviewed the patient's current medications. Scheduled: . enoxaparin (LOVENOX) injection  40 mg Subcutaneous Q12H  . folic acid  1 mg Oral Daily  . insulin aspart  0-5 Units Subcutaneous QHS  . insulin aspart  0-9 Units Subcutaneous TID WC  . LORazepam  0-4 mg Intravenous Q6H   Followed by  . LORazepam  0-4 mg Intravenous Q12H  . multivitamin with minerals  1 tablet Oral Daily  . potassium phosphate IVPB (mmol)  30 mmol Intravenous Once  . sodium chloride flush  3 mL Intravenous Q12H  . thiamine injection  500 mg Intravenous TID  . venlafaxine XR  300 mg Oral Daily    Assessment/Plan: Patient continues to complain of LUE numbness.  Strength in LLE is improved.  MRI of the brain reviewed and shows no acute changes.    Recommendations: 1.  With continued LUE complaints would perform CT of the cervical spine   LOS: 2 days   Thana Farr, MD Neurology [phone] 04/23/2016  9:59 AM

## 2016-04-23 NOTE — Plan of Care (Signed)
Problem: Pain Managment: Goal: General experience of comfort will improve Outcome: Progressing CIWA 0  Problem: Activity: Goal: Risk for activity intolerance will decrease Outcome: Progressing Up to [facility].  Needs assistance x 1.

## 2016-04-23 NOTE — Clinical Social Work Placement (Signed)
   CLINICAL SOCIAL WORK PLACEMENT  NOTE  Date:  04/23/2016  Patient Details  Name: Shelby Stewart MRN: [id number] Date of Birth: [birth year]  Clinical Social Work is seeking post-discharge placement for this patient at the Skilled  Nursing Facility level of care (*CSW will initial, date and re-position this form in  chart as items are completed):  Yes   Patient/family provided with [facility] Clinical Social Work Department's list of facilities offering this level of care within the geographic area requested by the patient (or if unable, by the patient's family).  Yes   Patient/family informed of their freedom to choose among providers that offer the needed level of care, that participate in Medicare, Medicaid or managed care program needed by the patient, have an available bed and are willing to accept the patient.  Yes   Patient/family informed of [facility]'s ownership interest in [facility] and [facility], as well as of the fact that they are under no obligation to receive care at these facilities.  PASRR submitted to EDS on       PASRR number received on       Existing PASRR number confirmed on 04/23/16     FL2 transmitted to all facilities in geographic area requested by pt/family on 04/23/16     FL2 transmitted to all facilities within larger geographic area on       Patient informed that his/her managed care company has contracts with or will negotiate with certain facilities, including the following:            Patient/family informed of bed offers received.  Patient chooses bed at       Physician recommends and patient chooses bed at      Patient to be transferred to   on  .  Patient to be transferred to facility by       Patient family notified on   of transfer.  Name of family member notified:        PHYSICIAN       Additional Comment:    _______________________________________________ Judi Cong, LCSW 04/23/2016, 11:43 AM

## 2016-04-23 NOTE — Progress Notes (Signed)
MEDICATION RELATED CONSULT NOTE - FOLLOW UP   Pharmacy Consult for electrolyte replacement and monitoring Indication: hypophosphatemia  Allergies  Allergen Reactions  . Penicillins Shortness Of Breath and Other (See Comments)    Per pt "itching, swelling all over body and trouble breathing"  . Doxycycline Calcium Other (See Comments)  . Other     Paper tape- rash  . Sulfa Antibiotics     hives  . Vancomycin Other (See Comments)    Per pt "itching, swelling all over body and trouble breathing"  . Amoxicillin Rash  . Ciprofloxacin Rash and Other (See Comments)    Skin peeling  . Clindamycin Itching   Patient Measurements: Height: 5' (152.4 cm) Weight: 213 lb (96.6 kg) IBW/kg (Calculated) : 45.5  Vital Signs: Temp: 98.6 F (37 C) (04/07 1406) BP: 114/72 (04/07 1406) Pulse Rate: 83 (04/07 1406) Intake/Output from previous day: 04/06 0701 - 04/07 0700 In: 1801.3 [P.O.:720; I.V.:981.3; IV Piggyback:100] Out: 1100 [Urine:1100] Intake/Output from this shift: Total I/O In: 240 [P.O.:240] Out: -   Labs:  Recent Labs  04/21/16 1017  04/21/16 1733 04/22/16 0445 04/23/16 0636 04/23/16 1745  WBC 13.7*  --  13.9* 9.8  --   --   HGB 15.6  --  16.2* 13.9  --   --   HCT 46.6  --  48.0* 40.5  --   --   PLT 174  --  111* 150  --   --   CREATININE 0.84  --  0.86 0.73 0.64  --   MG  --   --  2.2 2.0 1.7  --   PHOS  --   < > 1.3* 1.3* 1.7* 3.7  ALBUMIN 3.5  --   --   --  3.1*  --   PROT 6.8  --   --   --  5.5*  --   AST 74*  --   --   --  39  --   ALT 36  --   --   --  27  --   ALKPHOS 88  --   --   --  76  --   BILITOT 2.3*  --   --   --  1.3*  --   BILIDIR 0.5  --   --   --   --   --   IBILI 1.8*  --   --   --   --   --   < > = values in this interval not displayed. Estimated Creatinine Clearance: 70 mL/min (by C-G formula based on SCr of 0.64 mg/dL).   Microbiology: No results found for this or any previous visit (from the past 720 hour(s)).  Medical History: Past  Medical History:  Diagnosis Date  . Asthma   . COPD (chronic obstructive pulmonary disease) (HCC)   . Diabetes mellitus without complication (HCC)   . Hypertension     Medications:  Infusions:    Assessment: 66 yof cc difficulty walking and confusion, PMH alcohol use disorder, asthma, COPD, DM, HTN. Fell approximately 5 days ago and has had difficulty with ambulating since due to leg pain. Ethyl alcohol <5 mg/dL on admission however multiple bottles of alcohol were found in her apartment according to EMS. Pharmacy consulted to monitor and replace electrolytes.  Goal of Therapy:  Electrolytes WNL  Plan:  K = 3.2, Mg = 1.7, Phos = 1.7  MD Sudini ordered KPhos 30 mmol IV x 1. Will recheck Phos and K @ 18:00.  4/07 @ 17:45 :  K = 4.5, Phos = 3.7 .  No additional supplementation needed at this time.  Will recheck electrolytes on 4/8 with AM labs.   Ciarrah Rae D, Pharm.D. Clinical Pharmacist 04/23/2016,6:39 PM

## 2016-04-23 NOTE — NC FL2 (Signed)
[place] MEDICAID FL2 LEVEL OF CARE SCREENING TOOL     IDENTIFICATION  Patient Name: Shelby Stewart Birthdate: 06/09/1950 Sex: female Admission Date (Current Location): 04/21/2016  [place] and [place] Number:  Chiropodist and Address:  [facility], [address] [place], [place] [address]      Provider Number: [id number]  Attending Physician Name and Address:  Milagros Loll, MD  Relative Name and Phone Number:       Current Level of Care: Hospital Recommended Level of Care: Skilled Nursing Facility Prior Approval Number:    Date Approved/Denied:   PASRR Number: [id number]  Discharge Plan: SNF    Current Diagnoses: Patient Active Problem List   Diagnosis Date Noted  . Rhabdomyolysis 04/21/2016  . Elevated LFTs 12/28/2015    Orientation RESPIRATION BLADDER Height & Weight     Self, Time, Situation, Place  Normal Continent Weight: 213 lb (96.6 kg) Height:  5' (152.4 cm)  BEHAVIORAL SYMPTOMS/MOOD NEUROLOGICAL BOWEL NUTRITION STATUS      Continent    AMBULATORY STATUS COMMUNICATION OF NEEDS Skin   Extensive Assist Verbally Normal                       Personal Care Assistance Level of Assistance  Bathing, Feeding, Dressing Bathing Assistance: Limited assistance Feeding assistance: Independent Dressing Assistance: Limited assistance     Functional Limitations Info             SPECIAL CARE FACTORS FREQUENCY  PT (By licensed PT)     PT Frequency: Up to 5X per day, 5 days per week              Contractures Contractures Info: Present    Additional Factors Info  Allergies   Allergies Info: Penicillins, Doxycycline Calcium, Other, Sulfa Antibiotics, Vancomycin, Amoxicillin, Ciprofloxacin, Clindamycin           Current Medications (04/23/2016):  This is the current hospital active medication list Current Facility-Administered Medications  Medication Dose Route Frequency Provider Last Rate Last Dose  .  acetaminophen (TYLENOL) tablet 650 mg  650 mg Oral Q6H PRN Adrian Saran, MD   650 mg at 04/22/16 1339   Or  . acetaminophen (TYLENOL) suppository 650 mg  650 mg Rectal Q6H PRN Sital Mody, MD      . albuterol (PROVENTIL) (2.5 MG/3ML) 0.083% nebulizer solution 3 mL  3 mL Inhalation Q6H PRN Sital Mody, MD      . bisacodyl (DULCOLAX) EC tablet 5 mg  5 mg Oral Daily PRN Sital Mody, MD      . enoxaparin (LOVENOX) injection 40 mg  40 mg Subcutaneous Q12H Jodelle Red Swayne, RPH   40 mg at 04/23/16 1007  . folic acid (FOLVITE) tablet 1 mg  1 mg Oral Daily Adrian Saran, MD   1 mg at 04/23/16 1007  . HYDROcodone-acetaminophen (NORCO/VICODIN) 5-325 MG per tablet 1-2 tablet  1-2 tablet Oral Q4H PRN Adrian Saran, MD   2 tablet at 04/23/16 0841  . insulin aspart (novoLOG) injection 0-5 Units  0-5 Units Subcutaneous QHS Cindi Carbon, [facility]      . insulin aspart (novoLOG) injection 0-9 Units  0-9 Units Subcutaneous TID WC Adrian Saran, MD   2 Units at 04/23/16 5621  . LORazepam (ATIVAN) injection 0-4 mg  0-4 mg Intravenous Q6H Merrily Brittle, MD   Stopped at 04/21/16 1220   Followed by  . LORazepam (ATIVAN) injection 0-4 mg  0-4 mg Intravenous Q12H Merrily Brittle,  MD      . LORazepam (ATIVAN) tablet 1 mg  1 mg Oral Q6H PRN Adrian Saran, MD   1 mg at 04/22/16 0032   Or  . LORazepam (ATIVAN) injection 1 mg  1 mg Intravenous Q6H PRN Adrian Saran, MD      . multivitamin with minerals tablet 1 tablet  1 tablet Oral Daily Adrian Saran, MD   1 tablet at 04/23/16 1007  . ondansetron (ZOFRAN) tablet 4 mg  4 mg Oral Q6H PRN Adrian Saran, MD   4 mg at 04/22/16 0024   Or  . ondansetron (ZOFRAN) injection 4 mg  4 mg Intravenous Q6H PRN Adrian Saran, MD      . phenol (CHLORASEPTIC) mouth spray 1 spray  1 spray Mouth/Throat PRN Oralia Manis, MD      . phenol-menthol (CEPASTAT) lozenge 1 lozenge  1 lozenge Buccal PRN Oralia Manis, MD   1 lozenge at 04/22/16 0525  . potassium phosphate 30 mmol in dextrose 5 % 500 mL infusion  30 mmol Intravenous Once  Srikar Sudini, MD   30 mmol at 04/23/16 1000  . senna-docusate (Senokot-S) tablet 1 tablet  1 tablet Oral QHS PRN Adrian Saran, MD      . sodium chloride flush (NS) 0.9 % injection 3 mL  3 mL Intravenous Q12H Sital Mody, MD   3 mL at 04/23/16 1000  . thiamine  in normal saline (50ml) IVPB  500 mg Intravenous TID Adrian Saran, MD   500 mg at 04/23/16 0546  . venlafaxine XR (EFFEXOR-XR) 24 hr capsule 300 mg  300 mg Oral Daily Adrian Saran, MD   300 mg at 04/23/16 1007     Discharge Medications: Please see discharge summary for a list of discharge medications.  Relevant Imaging Results:  Relevant Lab Results:   Additional Information SS# [id number]  Judi Cong, LCSW

## 2016-04-23 NOTE — Progress Notes (Signed)
[facility] - [place] at [facility]   PATIENT NAME: Flordia Kassem    MR#:  [id number]  DATE OF BIRTH:  September 26, 1950  SUBJECTIVE:  CHIEF COMPLAINT:   Chief Complaint  Patient presents with  . Altered Mental Status   Patient is alert and awake. Complains of mild right shoulder pain since her fall. No nausea or vomiting or abdominal pain. No alcohol withdrawals in the hospital. Waiting for a bed at SNF.  REVIEW OF SYSTEMS:    Review of Systems  Constitutional: Positive for malaise/fatigue. Negative for chills and fever.  HENT: Negative for sore throat.   Eyes: Negative for blurred vision, double vision and pain.  Respiratory: Negative for cough, hemoptysis, shortness of breath and wheezing.   Cardiovascular: Negative for chest pain, palpitations, orthopnea and leg swelling.  Gastrointestinal: Negative for abdominal pain, constipation, diarrhea, heartburn, nausea and vomiting.  Genitourinary: Negative for dysuria and hematuria.  Musculoskeletal: Positive for falls. Negative for back pain and joint pain.  Skin: Negative for rash.  Neurological: Positive for weakness. Negative for sensory change, speech change, focal weakness and headaches.  Endo/Heme/Allergies: Does not bruise/bleed easily.  Psychiatric/Behavioral: Negative for depression. The patient is not nervous/anxious.     DRUG ALLERGIES:   Allergies  Allergen Reactions  . Penicillins Shortness Of Breath and Other (See Comments)    Per pt "itching, swelling all over body and trouble breathing"  . Doxycycline Calcium Other (See Comments)  . Other     Paper tape- rash  . Sulfa Antibiotics     hives  . Vancomycin Other (See Comments)    Per pt "itching, swelling all over body and trouble breathing"  . Amoxicillin Rash  . Ciprofloxacin Rash and Other (See Comments)    Skin peeling  . Clindamycin Itching    VITALS:  Blood pressure (!) 119/52, pulse 77, temperature 98 F (36.7 C), temperature source  Oral, resp. rate 18, height 5' (1.524 m), weight 96.6 kg (213 lb), SpO2 96 %.  PHYSICAL EXAMINATION:   Physical Exam  GENERAL:  66 y.o.-year-old patient lying in the bed with no acute distress.  EYES: Pupils equal, round, reactive to light and accommodation. No scleral icterus. Extraocular muscles intact.  HEENT: Head atraumatic, normocephalic. Oropharynx and nasopharynx clear.  NECK:  Supple, no jugular venous distention. No thyroid enlargement, no tenderness.  LUNGS: Normal breath sounds bilaterally, no wheezing, rales, rhonchi. No use of accessory muscles of respiration.  CARDIOVASCULAR: S1, S2 normal. No murmurs, rubs, or gallops.  ABDOMEN: Soft, nontender, nondistended. Bowel sounds present. No organomegaly or mass.  EXTREMITIES: No cyanosis, clubbing or edema b/l.    NEUROLOGIC: Cranial nerves II through XII are intact. No focal Motor or sensory deficits b/l.   PSYCHIATRIC: The patient is alert and awake SKIN: No obvious rash, lesion, or ulcer.   LABORATORY PANEL:   CBC  Recent Labs Lab 04/22/16 0445  WBC 9.8  HGB 13.9  HCT 40.5  PLT 150   ------------------------------------------------------------------------------------------------------------------ Chemistries   Recent Labs Lab 04/23/16 0636  NA 135  K 3.2*  CL 105  CO2 26  GLUCOSE 182*  BUN 14  CREATININE 0.64  CALCIUM 8.5*  MG 1.7  AST 39  ALT 27  ALKPHOS 76  BILITOT 1.3*   ------------------------------------------------------------------------------------------------------------------  Cardiac Enzymes  Recent Labs Lab 04/22/16 0445  TROPONINI <0.03   ------------------------------------------------------------------------------------------------------------------  RADIOLOGY:  Ct Cervical Spine Wo Contrast  Result Date: 04/23/2016 CLINICAL DATA:  Upper extremity numbness.  Fall 6 days ago. EXAM: CT  CERVICAL SPINE WITHOUT CONTRAST TECHNIQUE: Multidetector CT imaging of the cervical spine was  performed without intravenous contrast. Multiplanar CT image reconstructions were also generated. COMPARISON:  None. FINDINGS: Alignment: There is straightening of normal lordosis. Minimal anterolisthesis of C3 versus C4 measuring less than 2 mm on coronal image 35 may be due to facet degenerative changes. No overlying soft tissue swelling or fracture seen. No other malalignment. Skull base and vertebrae: No fractures identified. Multilevel degenerative disc disease most prominent at C4-5 and C5-6. Facet degenerative changes identified. Soft tissues and spinal canal: Mild canal narrowing at C5-6 with an AP diameter of 9.6 mm. No other significant canal narrowing. Carotid calcifications identified. No prevertebral soft tissue swelling identified. No other soft tissue abnormalities. Disc levels: Multilevel degenerative changes. Mild narrowing of the bilateral neural foramina at C5-6. Upper chest: Negative. Other: No other abnormalities. IMPRESSION: 1. Mild canal narrowing at C5-6 with 10 mm AP diameter. Mild bilateral neural foraminal narrowing at the same level. Multilevel degenerative disc disease. Multilevel facet degenerative changes. No fracture. 2. Minimal anterolisthesis of C3 versus C4 is favored to be due to the facet degenerative changes. Electronically Signed   By: Gerome Sam III M.D   On: 04/23/2016 11:16   Mr Brain Wo Contrast  Result Date: 04/22/2016 CLINICAL DATA:  Left lower extremity weakness EXAM: MRI HEAD WITHOUT CONTRAST TECHNIQUE: Multiplanar, multiecho pulse sequences of the brain and surrounding structures were obtained without intravenous contrast. COMPARISON:  Head CT 04/21/2016 FINDINGS: Brain: No focal diffusion restriction to indicate acute infarct. No intraparenchymal hemorrhage. There is mild multifocal hyperintense T2-weighted signal within the periventricular white matter, most often seen in the setting of chronic microvascular ischemia. No mass lesion or midline shift. No  hydrocephalus or extra-axial fluid collection. There is a small pineal gland cyst. No lobar predominant atrophy. Vascular: Major intracranial arterial and venous sinus flow voids are preserved. No evidence of chronic microhemorrhage or amyloid angiopathy. Skull and upper cervical spine: The visualized skull base, calvarium, upper cervical spine and extracranial soft tissues are normal. Sinuses/Orbits: No fluid levels or advanced mucosal thickening. No mastoid effusion. Normal orbits. IMPRESSION: Mild atrophy and chronic microvascular ischemia without acute intracranial abnormality. Electronically Signed   By: Deatra Robinson M.D.   On: 04/22/2016 14:46     ASSESSMENT AND PLAN:   66 year old female with a history of EtOH abuse who apparently had a fall about in her permanent 5 days ago and was unable to stand presents with encephalopathy and rhabdomyolysis.  * Acute rhabdomyolysis due to fall Improving with IV fluids. CK trending down  * Acute encephalopathy with ataxia - Improving Neurology consultation. Discussed with neurology Dr. Thad Ranger MRI of the brain showed no acute strokes. Unlikely Wernicke's encephalopathy. She did receive high-dose IV Timentin since admission. We will change to oral thiamine.  * Elevated troponin due to rhabdomyolysis Continue telemetry Trending down along with CK No chest pain.  * Diabetes: Currently not taking prescribed metformin  Start sliding scale insulin Hgba1c - 8.2 Start metformin  * EtOH abuse: Continue CIWA protocol  * COPD without exacerbation: Continue inhalers  * Essential hypertension metoprolol  All the records are reviewed and case discussed with Care Management/Social Worker Management plans discussed with the patient, family and they are in agreement.  CODE STATUS: FULL CODE  DVT Prophylaxis: SCDs  TOTAL TIME TAKING CARE OF THIS PATIENT: 35 minutes.   POSSIBLE D/C IN 1-2 DAYS, DEPENDING ON CLINICAL CONDITION.  Milagros Loll R M.D on 04/23/2016 at 12:56  PM  Between 7am to 6pm - Pager - [phone]  After 6pm go to [email or web address] - password [email or web address] [facility]  [facility] [place] [facility]  Office  [phone]  CC: Primary care physician; No PCP Per Patient  Note: This dictation was prepared with Dragon dictation along with smaller phrase technology. Any transcriptional errors that result from this process are unintentional.

## 2016-04-24 LAB — BASIC METABOLIC PANEL
ANION GAP: 5 (ref 5–15)
BUN: 15 mg/dL (ref 6–20)
CALCIUM: 8.8 mg/dL — AB (ref 8.9–10.3)
CO2: 27 mmol/L (ref 22–32)
CREATININE: 0.42 mg/dL — AB (ref 0.44–1.00)
Chloride: 105 mmol/L (ref 101–111)
Glucose, Bld: 226 mg/dL — ABNORMAL HIGH (ref 65–99)
Potassium: 3.7 mmol/L (ref 3.5–5.1)
Sodium: 137 mmol/L (ref 135–145)

## 2016-04-24 LAB — PHOSPHORUS: Phosphorus: 2.9 mg/dL (ref 2.5–4.6)

## 2016-04-24 LAB — GLUCOSE, CAPILLARY
GLUCOSE-CAPILLARY: 167 mg/dL — AB (ref 65–99)
GLUCOSE-CAPILLARY: 217 mg/dL — AB (ref 65–99)
GLUCOSE-CAPILLARY: 166 mg/dL — AB (ref 65–99)
GLUCOSE-CAPILLARY: 191 mg/dL — AB (ref 65–99)

## 2016-04-24 LAB — VITAMIN B1: VITAMIN B1 (THIAMINE): 232.1 nmol/L — AB (ref 66.5–200.0)

## 2016-04-24 MED ORDER — POTASSIUM CHLORIDE IN NACL 20-0.9 MEQ/L-% IV SOLN
INTRAVENOUS | Status: DC
  Filled 2016-04-24 (×2): qty 1000

## 2016-04-24 MED ORDER — INSULIN ASPART 100 UNIT/ML ~~LOC~~ SOLN
0.0000 [IU] | Freq: Three times a day (TID) | SUBCUTANEOUS | Status: DC
  Administered 2016-04-24: 3 [IU] via SUBCUTANEOUS
  Administered 2016-04-24 – 2016-04-25 (×4): 2 [IU] via SUBCUTANEOUS
  Administered 2016-04-26: 1 [IU] via SUBCUTANEOUS
  Administered 2016-04-26: 12:00:00 2 [IU] via SUBCUTANEOUS
  Filled 2016-04-24: qty 2
  Filled 2016-04-24: qty 1
  Filled 2016-04-24: qty 2
  Filled 2016-04-24 (×2): qty 3
  Filled 2016-04-24 (×2): qty 2

## 2016-04-24 MED ORDER — INSULIN ASPART 100 UNIT/ML ~~LOC~~ SOLN
0.0000 [IU] | Freq: Every day | SUBCUTANEOUS | Status: DC
  Administered 2016-04-25: 21:00:00 2 [IU] via SUBCUTANEOUS
  Filled 2016-04-24: qty 3

## 2016-04-24 MED ORDER — POLYVINYL ALCOHOL 1.4 % OP SOLN
1.0000 [drp] | OPHTHALMIC | Status: DC | PRN
  Filled 2016-04-24: qty 15

## 2016-04-24 MED ORDER — BISACODYL 5 MG PO TBEC
10.0000 mg | DELAYED_RELEASE_TABLET | Freq: Every day | ORAL | Status: AC
Start: 2016-04-24 — End: 2016-04-25
  Administered 2016-04-24 – 2016-04-25 (×2): 10 mg via ORAL
  Filled 2016-04-24 (×2): qty 2

## 2016-04-24 MED ORDER — POTASSIUM CHLORIDE IN NACL 20-0.9 MEQ/L-% IV SOLN: INTRAVENOUS | Status: DC

## 2016-04-24 NOTE — Clinical Social Work Note (Signed)
CSW obtained 30 day note for patient. The original PASSR is Level II, and a change of status has been entered in [facility] MUST which will require a nurse review. CSW will con't to follow.  Argentina Ponder, MSW, Theresia Majors [phone]

## 2016-04-24 NOTE — Progress Notes (Signed)
MEDICATION RELATED CONSULT NOTE - FOLLOW UP   Pharmacy Consult for electrolyte replacement and monitoring Indication: hypophosphatemia  Allergies  Allergen Reactions  . Penicillins Shortness Of Breath and Other (See Comments)    Per pt "itching, swelling all over body and trouble breathing"  . Doxycycline Calcium Other (See Comments)  . Other     Paper tape- rash  . Sulfa Antibiotics     hives  . Vancomycin Other (See Comments)    Per pt "itching, swelling all over body and trouble breathing"  . Amoxicillin Rash  . Ciprofloxacin Rash and Other (See Comments)    Skin peeling  . Clindamycin Itching   Patient Measurements: Height: 5' (152.4 cm) Weight: 213 lb (96.6 kg) IBW/kg (Calculated) : 45.5  Vital Signs: Temp: 97.5 F (36.4 C) (04/08 0410) Temp Source: Oral (04/08 0410) BP: 131/79 (04/08 0410) Pulse Rate: 89 (04/08 0410) Intake/Output from previous day: 04/07 0701 - 04/08 0700 In: 480 [P.O.:480] Out: 500 [Urine:500] Intake/Output from this shift: No intake/output data recorded.  Labs:  Recent Labs  04/21/16 1017  04/21/16 1733 04/22/16 0445 04/23/16 0636 04/23/16 1745 04/24/16 0605  WBC 13.7*  --  13.9* 9.8  --   --   --   HGB 15.6  --  16.2* 13.9  --   --   --   HCT 46.6  --  48.0* 40.5  --   --   --   PLT 174  --  111* 150  --   --   --   CREATININE 0.84  --  0.86 0.73 0.64  --  0.42*  MG  --   --  2.2 2.0 1.7  --   --   PHOS  --   < > 1.3* 1.3* 1.7* 3.7 2.9  ALBUMIN 3.5  --   --   --  3.1*  --   --   PROT 6.8  --   --   --  5.5*  --   --   AST 74*  --   --   --  39  --   --   ALT 36  --   --   --  27  --   --   ALKPHOS 88  --   --   --  76  --   --   BILITOT 2.3*  --   --   --  1.3*  --   --   BILIDIR 0.5  --   --   --   --   --   --   IBILI 1.8*  --   --   --   --   --   --   < > = values in this interval not displayed. Estimated Creatinine Clearance: 70 mL/min (A) (by C-G formula based on SCr of 0.42 mg/dL (L)).   Microbiology: No results  found for this or any previous visit (from the past 720 hour(s)).  Medical History: Past Medical History:  Diagnosis Date  . Asthma   . COPD (chronic obstructive pulmonary disease) (HCC)   . Diabetes mellitus without complication (HCC)   . Hypertension     Medications:  Infusions:    Assessment: 66 yof cc difficulty walking and confusion, PMH alcohol use disorder, asthma, COPD, DM, HTN. Fell approximately 5 days ago and has had difficulty with ambulating since due to leg pain. Ethyl alcohol <5 mg/dL on admission however multiple bottles of alcohol were found in her apartment according to EMS.  Pharmacy consulted to monitor and replace electrolytes.  Goal of Therapy:  Electrolytes WNL  Plan:   4/8: All electrolytes are wnl. Will recheck electrolytes with am labs.   Shelby Stewart D, Pharm.D. Clinical Pharmacist 04/24/2016,9:07 AM

## 2016-04-24 NOTE — Progress Notes (Signed)
qPhysical Therapy Treatment Patient Details Name: Shelby Stewart MRN: [id number] DOB: [birth year] Today's Date: 04/24/2016    History of Present Illness Pt admitted for rhabdomolysis. Pt with complaints of difficulty walking along with confusion secondary to fall x 5 days ago. She reports she has been unable to walk since then. History includes B THR, L TKR, ETOH abuse, falls, COPD, DM, and HTN. Pt with pending neuro consult    PT Comments    Requested to see patient by RN, stating she has improved; would like re-assessment for consideration of discharge home.  Patient does demonstrate improved gait tolerance and stability compared to initial evaluation, but does still require use of RW and +1 for safety (due to intermittent lateral LOB). Initiated stair negotiation (up/down 6 steps with bilat rails); requires min assist for safety. Unable to complete full flight (required for access to bed/bathroom in home environment) due to pain and fatigue in bilat UE/LEs. Despite improvement in mobility, patient lacks ability to fully and safely negotiate home environment at this time.   Continue to recommend transition to STR prior to return home alone for optimal safety upon discharge. Will continue to progress/assess throughout remaining hospitalization.   Follow Up Recommendations  SNF     Equipment Recommendations  Rolling walker with 5" wheels    Recommendations for Other Services       Precautions / Restrictions Precautions Precautions: Fall Restrictions Weight Bearing Restrictions: No    Mobility  Bed Mobility Overal bed mobility: Modified Independent                Transfers Overall transfer level: Needs assistance Equipment used: Rolling walker (2 wheeled) Transfers: Sit to/from Stand Sit to Stand: Min guard;Min assist            Ambulation/Gait Ambulation/Gait assistance: Min guard;Min assist Ambulation Distance (Feet): 200 Feet Assistive device: Rolling walker (2  wheeled)       General Gait Details: broad BOS with excessive lateral sway, R > L; mildly antalgic with 1-2 R lateral LOB requiring cga/min assist for correction.     Stairs Stairs: Yes   Stair Management: Two rails Number of Stairs: 6 General stair comments: step to gait pattern, min cuing for full R foot placement on step; min assist for safety and balance.  Unable to complete additional steps due to pain/weakness in bilat UE/LEs  Wheelchair Mobility    Modified Rankin (Stroke Patients Only)       Balance Overall balance assessment: Needs assistance Sitting-balance support: No upper extremity supported;Feet supported Sitting balance-Leahy Scale: Good     Standing balance support: Bilateral upper extremity supported Standing balance-Leahy Scale: Fair                              Cognition Arousal/Alertness: Awake/alert Behavior During Therapy: WFL for tasks assessed/performed Overall Cognitive Status: Within Functional Limits for tasks assessed                                        Exercises      General Comments        Pertinent Vitals/Pain Pain Assessment: No/denies pain    Home Living                      Prior Function  PT Goals (current goals can now be found in the care plan section) Acute Rehab PT Goals Patient Stated Goal: to go home PT Goal Formulation: With patient Time For Goal Achievement: 05/06/16 Potential to Achieve Goals: Good    Frequency    Min 2X/week      PT Plan      Co-evaluation             End of Session Equipment Utilized During Treatment: Gait belt Activity Tolerance: Patient tolerated treatment well Patient left: in bed;with call bell/phone within reach;with bed alarm set Nurse Communication: Mobility status PT Visit Diagnosis: Unsteadiness on feet (R26.81);Muscle weakness (generalized) (M62.81);History of falling (Z91.81)     Time: 1610-9604 PT Time  Calculation (min) (ACUTE ONLY): 14 min  Charges:  $Gait Training: 8-22 mins                    G Codes:      Tayshon Winker H. Manson Passey, PT, DPT, NCS 04/24/16, 4:41 PM [phone]

## 2016-04-24 NOTE — Progress Notes (Signed)
[facility] - [place] at [facility]   PATIENT NAME: Shelby Stewart    MR#:  [id number]  DATE OF BIRTH:  05-25-1950  SUBJECTIVE:  CHIEF COMPLAINT:   Chief Complaint  Patient presents with  . Altered Mental Status   Patient is alert and awake.  Some right shoulder pain since her fall. Constipated.  REVIEW OF SYSTEMS:    Review of Systems  Constitutional: Positive for malaise/fatigue. Negative for chills and fever.  HENT: Negative for sore throat.   Eyes: Negative for blurred vision, double vision and pain.  Respiratory: Negative for cough, hemoptysis, shortness of breath and wheezing.   Cardiovascular: Negative for chest pain, palpitations, orthopnea and leg swelling.  Gastrointestinal: Negative for abdominal pain, constipation, diarrhea, heartburn, nausea and vomiting.  Genitourinary: Negative for dysuria and hematuria.  Musculoskeletal: Positive for falls. Negative for back pain and joint pain.  Skin: Negative for rash.  Neurological: Positive for weakness. Negative for sensory change, speech change, focal weakness and headaches.  Endo/Heme/Allergies: Does not bruise/bleed easily.  Psychiatric/Behavioral: Negative for depression. The patient is not nervous/anxious.     DRUG ALLERGIES:   Allergies  Allergen Reactions  . Penicillins Shortness Of Breath and Other (See Comments)    Per pt "itching, swelling all over body and trouble breathing"  . Doxycycline Calcium Other (See Comments)  . Other     Paper tape- rash  . Sulfa Antibiotics     hives  . Vancomycin Other (See Comments)    Per pt "itching, swelling all over body and trouble breathing"  . Amoxicillin Rash  . Ciprofloxacin Rash and Other (See Comments)    Skin peeling  . Clindamycin Itching    VITALS:  Blood pressure 131/79, pulse 89, temperature 97.5 F (36.4 C), temperature source Oral, resp. rate 18, height 5' (1.524 m), weight 96.6 kg (213 lb), SpO2 92 %.  PHYSICAL EXAMINATION:    Physical Exam  GENERAL:  66 y.o.-year-old patient lying in the bed with no acute distress.  EYES: Pupils equal, round, reactive to light and accommodation. No scleral icterus. Extraocular muscles intact.  HEENT: Head atraumatic, normocephalic. Oropharynx and nasopharynx clear.  NECK:  Supple, no jugular venous distention. No thyroid enlargement, no tenderness.  LUNGS: Normal breath sounds bilaterally, no wheezing, rales, rhonchi. No use of accessory muscles of respiration.  CARDIOVASCULAR: S1, S2 normal. No murmurs, rubs, or gallops.  ABDOMEN: Soft, nontender, nondistended. Bowel sounds present. No organomegaly or mass.  EXTREMITIES: No cyanosis, clubbing or edema b/l.    NEUROLOGIC: Cranial nerves II through XII are intact. No focal Motor or sensory deficits b/l.   PSYCHIATRIC: The patient is alert and awake SKIN: No obvious rash, lesion, or ulcer.   LABORATORY PANEL:   CBC  Recent Labs Lab 04/22/16 0445  WBC 9.8  HGB 13.9  HCT 40.5  PLT 150   ------------------------------------------------------------------------------------------------------------------ Chemistries   Recent Labs Lab 04/23/16 0636  04/24/16 0605  NA 135  --  137  K 3.2*  < > 3.7  CL 105  --  105  CO2 26  --  27  GLUCOSE 182*  --  226*  BUN 14  --  15  CREATININE 0.64  --  0.42*  CALCIUM 8.5*  --  8.8*  MG 1.7  --   --   AST 39  --   --   ALT 27  --   --   ALKPHOS 76  --   --   BILITOT 1.3*  --   --   < > =  values in this interval not displayed. ------------------------------------------------------------------------------------------------------------------  Cardiac Enzymes  Recent Labs Lab 04/22/16 0445  TROPONINI <0.03   ------------------------------------------------------------------------------------------------------------------  RADIOLOGY:  Ct Cervical Spine Wo Contrast  Result Date: 04/23/2016 CLINICAL DATA:  Upper extremity numbness.  Fall 6 days ago. EXAM: CT CERVICAL  SPINE WITHOUT CONTRAST TECHNIQUE: Multidetector CT imaging of the cervical spine was performed without intravenous contrast. Multiplanar CT image reconstructions were also generated. COMPARISON:  None. FINDINGS: Alignment: There is straightening of normal lordosis. Minimal anterolisthesis of C3 versus C4 measuring less than 2 mm on coronal image 35 may be due to facet degenerative changes. No overlying soft tissue swelling or fracture seen. No other malalignment. Skull base and vertebrae: No fractures identified. Multilevel degenerative disc disease most prominent at C4-5 and C5-6. Facet degenerative changes identified. Soft tissues and spinal canal: Mild canal narrowing at C5-6 with an AP diameter of 9.6 mm. No other significant canal narrowing. Carotid calcifications identified. No prevertebral soft tissue swelling identified. No other soft tissue abnormalities. Disc levels: Multilevel degenerative changes. Mild narrowing of the bilateral neural foramina at C5-6. Upper chest: Negative. Other: No other abnormalities. IMPRESSION: 1. Mild canal narrowing at C5-6 with 10 mm AP diameter. Mild bilateral neural foraminal narrowing at the same level. Multilevel degenerative disc disease. Multilevel facet degenerative changes. No fracture. 2. Minimal anterolisthesis of C3 versus C4 is favored to be due to the facet degenerative changes. Electronically Signed   By: Gerome Sam III M.D   On: 04/23/2016 11:16   Mr Brain Wo Contrast  Result Date: 04/22/2016 CLINICAL DATA:  Left lower extremity weakness EXAM: MRI HEAD WITHOUT CONTRAST TECHNIQUE: Multiplanar, multiecho pulse sequences of the brain and surrounding structures were obtained without intravenous contrast. COMPARISON:  Head CT 04/21/2016 FINDINGS: Brain: No focal diffusion restriction to indicate acute infarct. No intraparenchymal hemorrhage. There is mild multifocal hyperintense T2-weighted signal within the periventricular white matter, most often seen in the  setting of chronic microvascular ischemia. No mass lesion or midline shift. No hydrocephalus or extra-axial fluid collection. There is a small pineal gland cyst. No lobar predominant atrophy. Vascular: Major intracranial arterial and venous sinus flow voids are preserved. No evidence of chronic microhemorrhage or amyloid angiopathy. Skull and upper cervical spine: The visualized skull base, calvarium, upper cervical spine and extracranial soft tissues are normal. Sinuses/Orbits: No fluid levels or advanced mucosal thickening. No mastoid effusion. Normal orbits. IMPRESSION: Mild atrophy and chronic microvascular ischemia without acute intracranial abnormality. Electronically Signed   By: Deatra Robinson M.D.   On: 04/22/2016 14:46     ASSESSMENT AND PLAN:   66 year old female with a history of EtOH abuse who apparently had a fall about in her permanent 5 days ago and was unable to stand presents with encephalopathy and rhabdomyolysis.  * Acute rhabdomyolysis due to fall Improving with IV fluids. CK trending down IVF stopped  * Acute encephalopathy with ataxia - Resolved Neurology consultation. Discussed with neurology Dr. Thad Ranger MRI of the brain showed no acute strokes. Unlikely Wernicke's encephalopathy. She did receive high-dose IV Thiamine since admission.  changed to oral thiamine.  * Elevated troponin due to rhabdomyolysis Continue telemetry Trending down along with CK No chest pain.  * Diabetes: Was not taking prescribed metformin  Start sliding scale insulin Hgba1c - 8.2 Started metformin  * EtOH abuse: Continue CIWA protocol  * COPD without exacerbation: Continue inhalers  * Essential hypertension Metoprolol  * Alcohol abuse It has been close to a week patient had alcohol. No withdrawals.  All the records are reviewed and case discussed with Care Management/Social Worker Management plans discussed with the patient, family and they are in agreement.  CODE  STATUS: FULL CODE  DVT Prophylaxis: SCDs  TOTAL TIME TAKING CARE OF THIS PATIENT: 35 minutes.   POSSIBLE D/C IN 1-2 DAYS, DEPENDING ON CLINICAL CONDITION.  Milagros Loll R M.D on 04/24/2016 at 11:04 AM  Between 7am to 6pm - Pager - [phone]  After 6pm go to [email or web address] - password [email or web address] [facility]  [facility] [place] [facility]  Office  [phone]  CC: Primary care physician; No PCP Per Patient  Note: This dictation was prepared with Dragon dictation along with smaller phrase technology. Any transcriptional errors that result from this process are unintentional.

## 2016-04-25 LAB — BASIC METABOLIC PANEL
Anion gap: 5 (ref 5–15)
BUN: 16 mg/dL (ref 6–20)
CALCIUM: 8.8 mg/dL — AB (ref 8.9–10.3)
CO2: 29 mmol/L (ref 22–32)
CREATININE: 0.64 mg/dL (ref 0.44–1.00)
Chloride: 104 mmol/L (ref 101–111)
GFR calc Af Amer: >60 mL/min (ref >60)
GLUCOSE: 163 mg/dL — AB (ref 65–99)
Potassium: 3.9 mmol/L (ref 3.5–5.1)
Sodium: 138 mmol/L (ref 135–145)

## 2016-04-25 LAB — GLUCOSE, CAPILLARY
GLUCOSE-CAPILLARY: 198 mg/dL — AB (ref 65–99)
GLUCOSE-CAPILLARY: 155 mg/dL — AB (ref 65–99)
Glucose-Capillary: 162 mg/dL — ABNORMAL HIGH (ref 65–99)
Glucose-Capillary: 221 mg/dL — ABNORMAL HIGH (ref 65–99)

## 2016-04-25 LAB — MAGNESIUM: MAGNESIUM: 1.7 mg/dL (ref 1.7–2.4)

## 2016-04-25 MED ORDER — METFORMIN HCL 850 MG PO TABS: 850.0000 mg | ORAL_TABLET | Freq: Two times a day (BID) | ORAL | 0 refills | Status: AC

## 2016-04-25 MED ORDER — LISINOPRIL 20 MG PO TABS: 20.0000 mg | ORAL_TABLET | Freq: Every day | ORAL | 0 refills | Status: AC

## 2016-04-25 NOTE — Progress Notes (Signed)
Clinical Education officer, museum (CSW) met with patient and presented bed offers. Patient chose [facility]. Per [facility] liaison he can accept patient tomorrow. PASARR is pending. CSW will continue to follow and assist as needed.   [employer], LCSW [phone]

## 2016-04-25 NOTE — Discharge Summary (Addendum)
[facility] - [place] at [facility]   PATIENT NAME: Shelby Stewart    MR#:  [id number]  DATE OF BIRTH:  28-Jul-1950  DATE OF ADMISSION:  04/21/2016 ADMITTING PHYSICIAN: Adrian Saran, MD  DATE OF DISCHARGE: 04/26/2016  PRIMARY CARE PHYSICIAN: No PCP Per Patient    ADMISSION DIAGNOSIS:  Elevated troponin [R74.8] Altered mental status, unspecified altered mental status type [R41.82]  DISCHARGE DIAGNOSIS:  Active Problems:   Rhabdomyolysis   SECONDARY DIAGNOSIS:   Past Medical History:  Diagnosis Date  . Asthma   . COPD (chronic obstructive pulmonary disease) (HCC)   . Diabetes mellitus without complication (HCC)   . Hypertension     HOSPITAL COURSE:   66 year old female with history of EtOH abuse who presented after fall and found to have rhabdomyolysis  1. Acute rhabdomyolysis due to mechanical fall: This is improved with IV fluids  2. Acute encephalopathy with ataxia: This has resolved MR the brain showed no acute stroke Case discussed with neurology and unlikely Wernicke's encephalopathy She did receive IV Thaimine.  3. Elevated troponin due to rhabdomyolysis not ACS  4. Diabetes: A1c was 8.2 Continue metformin  5. EtOH abuse: Uneventful detox on CIWA protocol  6. Essential hypertension: Continue Lisinopril  DISCHARGE CONDITIONS AND DIET:   Stable Diabetic diet  CONSULTS OBTAINED:  Treatment Team:  Thana Farr, MD Kym Groom, MD  DRUG ALLERGIES:   Allergies  Allergen Reactions  . Penicillins Shortness Of Breath and Other (See Comments)    Per pt "itching, swelling all over body and trouble breathing"  . Doxycycline Calcium Other (See Comments)  . Other     Paper tape- rash  . Sulfa Antibiotics     hives  . Vancomycin Other (See Comments)    Per pt "itching, swelling all over body and trouble breathing"  . Amoxicillin Rash  . Ciprofloxacin Rash and Other (See Comments)    Skin peeling  . Clindamycin Itching     DISCHARGE MEDICATIONS:   Current Discharge Medication List    CONTINUE these medications which have CHANGED   Details  lisinopril (PRINIVIL,ZESTRIL) 20 MG tablet Take 1 tablet (20 mg total) by mouth daily. Qty: 30 tablet, Refills: 0    metFORMIN (GLUCOPHAGE) 850 MG tablet Take 1 tablet (850 mg total) by mouth 2 (two) times daily with a meal. Qty: 60 tablet, Refills: 0      CONTINUE these medications which have NOT CHANGED   Details  traZODone (DESYREL) 50 MG tablet Take 2 tablets (100 mg total) by mouth at bedtime. Qty: 20 tablet, Refills: 0    venlafaxine (EFFEXOR) 75 MG tablet Take 300 mg by mouth daily. Take 4 caps ( ) daily    albuterol (PROVENTIL HFA;VENTOLIN HFA) 108 (90 Base) MCG/ACT inhaler Inhale 2 puffs into the lungs every 6 (six) hours as needed.     budesonide-formoterol (SYMBICORT) 160-4.5 MCG/ACT inhaler Inhale 2 puffs into the lungs 2 (two) times daily.     Disulfiram 500 MG TABS Take 500 mg by mouth.    docusate sodium (COLACE) 100 MG capsule Take 100 mg by mouth 2 (two) times daily.     folic acid (FOLVITE) 400 MCG tablet Take 400 mcg by mouth daily.     gabapentin (NEURONTIN) 300 MG capsule Take 300 mg by mouth.    Melatonin 3 MG TABS Take 3 mg by mouth.    simvastatin (ZOCOR) 40 MG tablet Take 40 mg by mouth.    Spacer/Aero-Holding Chambers (AEROCHAMBER MINI CHAMBER) DEVI Inhale 1  puff into the lungs 2 (two) times daily.     Vitamin D, Ergocalciferol, (DRISDOL) 50000 units CAPS capsule Take 50,000 Units by mouth every 7 (seven) days.     zinc sulfate 220 (50 Zn) MG capsule Take 220 mg by mouth.      STOP taking these medications     hydrochlorothiazide (HYDRODIURIL) 25 MG tablet      sulfamethoxazole-trimethoprim (BACTRIM DS,SEPTRA DS) 800-160 MG tablet      traMADol (ULTRAM) 50 MG tablet           Today   CHIEF COMPLAINT:   Plan to go to SNF today No issues overnight   VITAL SIGNS:  Blood pressure 113/64, pulse 68,  temperature 98.4 F (36.9 C), temperature source Oral, resp. rate 17, height 5' (1.524 m), weight 96.6 kg (213 lb), SpO2 95 %.   REVIEW OF SYSTEMS:  Review of Systems  Constitutional: Negative.  Negative for chills, fever and malaise/fatigue.  HENT: Negative.  Negative for ear discharge, ear pain, hearing loss, nosebleeds and sore throat.   Eyes: Negative.  Negative for blurred vision and pain.  Respiratory: Negative.  Negative for cough, hemoptysis, shortness of breath and wheezing.   Cardiovascular: Negative.  Negative for chest pain, palpitations and leg swelling.  Gastrointestinal: Negative.  Negative for abdominal pain, blood in stool, diarrhea, nausea and vomiting.  Genitourinary: Negative.  Negative for dysuria.  Musculoskeletal: Negative.  Negative for back pain.  Skin: Negative.   Neurological: Negative for dizziness, tremors, speech change, focal weakness, seizures and headaches.  Endo/Heme/Allergies: Negative.  Does not bruise/bleed easily.  Psychiatric/Behavioral: Negative.  Negative for depression, hallucinations and suicidal ideas.     PHYSICAL EXAMINATION:  GENERAL:  66 y.o.-year-old patient lying in the bed with no acute distress.  NECK:  Supple, no jugular venous distention. No thyroid enlargement, no tenderness.  LUNGS: Normal breath sounds bilaterally, no wheezing, rales,rhonchi  No use of accessory muscles of respiration.  CARDIOVASCULAR: S1, S2 normal. No murmurs, rubs, or gallops.  ABDOMEN: Soft, non-tender, non-distended. Bowel sounds present. No organomegaly or mass.  EXTREMITIES: No pedal edema, cyanosis, or clubbing.  PSYCHIATRIC: The patient is alert and oriented x 3.  SKIN: No obvious rash, lesion, or ulcer.   DATA REVIEW:   CBC  Recent Labs Lab 04/22/16 0445  WBC 9.8  HGB 13.9  HCT 40.5  PLT 150    Chemistries   Recent Labs Lab 04/23/16 0636  04/25/16 0452  NA 135  < > 138  K 3.2*  < > 3.9  CL 105  < > 104  CO2 26  < > 29  GLUCOSE  182*  < > 163*  BUN 14  < > 16  CREATININE 0.64  < > 0.64  CALCIUM 8.5*  < > 8.8*  MG 1.7  --  1.7  AST 39  --   --   ALT 27  --   --   ALKPHOS 76  --   --   BILITOT 1.3*  --   --   < > = values in this interval not displayed.  Cardiac Enzymes  Recent Labs Lab 04/21/16 1733 04/21/16 2300 04/22/16 0445  TROPONINI 0.04* 0.05* <0.03    Microbiology Results  @  RADIOLOGY:  No results found.    Current Discharge Medication List    CONTINUE these medications which have CHANGED   Details  lisinopril (PRINIVIL,ZESTRIL) 20 MG tablet Take 1 tablet (20 mg total) by mouth daily. Qty: 30 tablet, Refills: 0  metFORMIN (GLUCOPHAGE) 850 MG tablet Take 1 tablet (850 mg total) by mouth 2 (two) times daily with a meal. Qty: 60 tablet, Refills: 0      CONTINUE these medications which have NOT CHANGED   Details  traZODone (DESYREL) 50 MG tablet Take 2 tablets (100 mg total) by mouth at bedtime. Qty: 20 tablet, Refills: 0    venlafaxine (EFFEXOR) 75 MG tablet Take 300 mg by mouth daily. Take 4 caps ( ) daily    albuterol (PROVENTIL HFA;VENTOLIN HFA) 108 (90 Base) MCG/ACT inhaler Inhale 2 puffs into the lungs every 6 (six) hours as needed.     budesonide-formoterol (SYMBICORT) 160-4.5 MCG/ACT inhaler Inhale 2 puffs into the lungs 2 (two) times daily.     Disulfiram 500 MG TABS Take 500 mg by mouth.    docusate sodium (COLACE) 100 MG capsule Take 100 mg by mouth 2 (two) times daily.     folic acid (FOLVITE) 400 MCG tablet Take 400 mcg by mouth daily.     gabapentin (NEURONTIN) 300 MG capsule Take 300 mg by mouth.    Melatonin 3 MG TABS Take 3 mg by mouth.    simvastatin (ZOCOR) 40 MG tablet Take 40 mg by mouth.    Spacer/Aero-Holding Chambers (AEROCHAMBER MINI CHAMBER) DEVI Inhale 1 puff into the lungs 2 (two) times daily.     Vitamin D, Ergocalciferol, (DRISDOL) 50000 units CAPS capsule Take 50,000 Units by mouth every 7 (seven) days.     zinc sulfate 220  (50 Zn) MG capsule Take 220 mg by mouth.      STOP taking these medications     hydrochlorothiazide (HYDRODIURIL) 25 MG tablet      sulfamethoxazole-trimethoprim (BACTRIM DS,SEPTRA DS) 800-160 MG tablet      traMADol (ULTRAM) 50 MG tablet         Management plans discussed with the patient and she is in agreement. Stable for discharge   Patient should follow up with pcp at facility  CODE STATUS:     Code Status Orders        Start     Ordered   04/21/16 1659  Full code  Continuous     04/21/16 1659    Code Status History    Date Active Date Inactive Code Status Order ID Comments User Context   12/28/2015  7:23 PM 12/30/2015 11:42 PM Full Code [id number]  Enedina Finner, MD Inpatient    Advance Directive Documentation     Most Recent Value  Type of Advance Directive  Living will  Pre-existing out of facility DNR order (yellow form or pink MOST form)  -  "MOST" Form in Place?  -      TOTAL TIME TAKING CARE OF THIS PATIENT: 36 minutes.    Note: This dictation was prepared with Dragon dictation along with smaller phrase technology. Any transcriptional errors that result from this process are unintentional.  Mazella Deen M.D on 04/26/2016 at 7:15 AM  Between 7am to 6pm - Pager - [phone] After 6pm go to [email or web address] - Social research officer, government  [facility]  Office  [phone]  CC: Primary care physician; No PCP Per Patient

## 2016-04-25 NOTE — Progress Notes (Signed)
MEDICATION RELATED CONSULT NOTE - FOLLOW UP   Pharmacy Consult for electrolyte replacement and monitoring Indication: hypophosphatemia  Allergies  Allergen Reactions  . Penicillins Shortness Of Breath and Other (See Comments)    Per pt "itching, swelling all over body and trouble breathing"  . Doxycycline Calcium Other (See Comments)  . Other     Paper tape- rash  . Sulfa Antibiotics     hives  . Vancomycin Other (See Comments)    Per pt "itching, swelling all over body and trouble breathing"  . Amoxicillin Rash  . Ciprofloxacin Rash and Other (See Comments)    Skin peeling  . Clindamycin Itching   Patient Measurements: Height: 5' (152.4 cm) Weight: 213 lb (96.6 kg) IBW/kg (Calculated) : 45.5  Vital Signs: Temp: 97.6 F (36.4 C) (04/09 0418) Temp Source: Oral (04/09 0418) BP: 155/86 (04/09 0418) Pulse Rate: 77 (04/09 0418) Intake/Output from previous day: 04/08 0701 - 04/09 0700 In: 720 [P.O.:720] Out: -  Intake/Output from this shift: No intake/output data recorded.  Labs:  Recent Labs  04/23/16 0636 04/23/16 1745 04/24/16 0605 04/25/16 0452  CREATININE 0.64  --  0.42* 0.64  MG 1.7  --   --  1.7  PHOS 1.7* 3.7 2.9  --   ALBUMIN 3.1*  --   --   --   PROT 5.5*  --   --   --   AST 39  --   --   --   ALT 27  --   --   --   ALKPHOS 76  --   --   --   BILITOT 1.3*  --   --   --    Lab Results  Component Value Date   K 3.9 04/25/2016   Estimated Creatinine Clearance: 70 mL/min (by C-G formula based on SCr of 0.64 mg/dL).   Microbiology: No results found for this or any previous visit (from the past 720 hour(s)).  Medical History: Past Medical History:  Diagnosis Date  . Asthma   . COPD (chronic obstructive pulmonary disease) (HCC)   . Diabetes mellitus without complication (HCC)   . Hypertension    Assessment: 66 yof cc difficulty walking and confusion, PMH alcohol use disorder, asthma, COPD, DM, HTN. Fell approximately 5 days ago and has had  difficulty with ambulating since due to leg pain. Ethyl alcohol <5 mg/dL on admission however multiple bottles of alcohol were found in her apartment according to EMS. Pharmacy consulted to monitor and replace electrolytes.  Goal of Therapy:  Electrolytes WNL  Plan:   4/9: Electrolytes are wnl. Will recheck electrolytes with am labs.   Shelby Stewart A, Pharm.D. Clinical Pharmacist 04/25/2016,7:55 AM

## 2016-04-25 NOTE — Care Management Important Message (Signed)
Important Message  Patient Details  Name: Shelby Stewart MRN: [id number] Date of Birth: November 19, 1950   Medicare Important Message Given:  Yes    Gwenette Greet, RN 04/25/2016, 9:50 AM

## 2016-04-25 NOTE — Progress Notes (Addendum)
[facility] - [place] at [facility]   PATIENT NAME: Shelby Stewart    MR#:  [id number]  DATE OF BIRTH:  January 07, 1951  SUBJECTIVE:   doing well this am No issues overnight  REVIEW OF SYSTEMS:    Review of Systems  Constitutional: Negative.  Negative for chills, fever and malaise/fatigue.  HENT: Negative.  Negative for ear discharge, ear pain, hearing loss, nosebleeds and sore throat.   Eyes: Negative.  Negative for blurred vision and pain.  Respiratory: Negative.  Negative for cough, hemoptysis, shortness of breath and wheezing.   Cardiovascular: Negative.  Negative for chest pain, palpitations and leg swelling.  Gastrointestinal: Negative.  Negative for abdominal pain, blood in stool, diarrhea, nausea and vomiting.  Genitourinary: Negative.  Negative for dysuria.  Musculoskeletal: Negative.  Negative for back pain.  Skin: Negative.   Neurological: Negative for dizziness, tremors, speech change, focal weakness, seizures and headaches.  Endo/Heme/Allergies: Negative.  Does not bruise/bleed easily.  Psychiatric/Behavioral: Negative.  Negative for depression, hallucinations and suicidal ideas.  All other systems reviewed and are negative.   Tolerating Diet: yes      DRUG ALLERGIES:   Allergies  Allergen Reactions  . Penicillins Shortness Of Breath and Other (See Comments)    Per pt "itching, swelling all over body and trouble breathing"  . Doxycycline Calcium Other (See Comments)  . Other     Paper tape- rash  . Sulfa Antibiotics     hives  . Vancomycin Other (See Comments)    Per pt "itching, swelling all over body and trouble breathing"  . Amoxicillin Rash  . Ciprofloxacin Rash and Other (See Comments)    Skin peeling  . Clindamycin Itching    VITALS:  Blood pressure (!) 155/86, pulse 77, temperature 97.6 F (36.4 C), temperature source Oral, resp. rate 20, height 5' (1.524 m), weight 96.6 kg (213 lb), SpO2 99 %.  PHYSICAL EXAMINATION:   Physical  Exam  Constitutional: She is oriented to person, place, and time and well-developed, well-nourished, and in no distress. No distress.  HENT:  Head: Normocephalic.  Eyes: No scleral icterus.  Neck: Normal range of motion. Neck supple. No JVD present. No tracheal deviation present.  Cardiovascular: Normal rate, regular rhythm and normal heart sounds.  Exam reveals no gallop and no friction rub.   No murmur heard. Pulmonary/Chest: Effort normal and breath sounds normal. No respiratory distress. She has no wheezes. She has no rales. She exhibits no tenderness.  Abdominal: Soft. Bowel sounds are normal. She exhibits no distension and no mass. There is no tenderness. There is no rebound and no guarding.  Musculoskeletal: Normal range of motion. She exhibits no edema.  Neurological: She is alert and oriented to person, place, and time.  Skin: Skin is warm. No rash noted. No erythema.  Psychiatric: Affect and judgment normal.      LABORATORY PANEL:   CBC  Recent Labs Lab 04/22/16 0445  WBC 9.8  HGB 13.9  HCT 40.5  PLT 150   ------------------------------------------------------------------------------------------------------------------  Chemistries   Recent Labs Lab 04/23/16 0636  04/25/16 0452  NA 135  < > 138  K 3.2*  < > 3.9  CL 105  < > 104  CO2 26  < > 29  GLUCOSE 182*  < > 163*  BUN 14  < > 16  CREATININE 0.64  < > 0.64  CALCIUM 8.5*  < > 8.8*  MG 1.7  --  1.7  AST 39  --   --  ALT 27  --   --   ALKPHOS 76  --   --   BILITOT 1.3*  --   --   < > = values in this interval not displayed. ------------------------------------------------------------------------------------------------------------------  Cardiac Enzymes  Recent Labs Lab 04/21/16 1733 04/21/16 2300 04/22/16 0445  TROPONINI 0.04* 0.05* <0.03   ------------------------------------------------------------------------------------------------------------------  RADIOLOGY:  Ct Cervical Spine Wo  Contrast  Result Date: 04/23/2016 CLINICAL DATA:  Upper extremity numbness.  Fall 6 days ago. EXAM: CT CERVICAL SPINE WITHOUT CONTRAST TECHNIQUE: Multidetector CT imaging of the cervical spine was performed without intravenous contrast. Multiplanar CT image reconstructions were also generated. COMPARISON:  None. FINDINGS: Alignment: There is straightening of normal lordosis. Minimal anterolisthesis of C3 versus C4 measuring less than 2 mm on coronal image 35 may be due to facet degenerative changes. No overlying soft tissue swelling or fracture seen. No other malalignment. Skull base and vertebrae: No fractures identified. Multilevel degenerative disc disease most prominent at C4-5 and C5-6. Facet degenerative changes identified. Soft tissues and spinal canal: Mild canal narrowing at C5-6 with an AP diameter of 9.6 mm. No other significant canal narrowing. Carotid calcifications identified. No prevertebral soft tissue swelling identified. No other soft tissue abnormalities. Disc levels: Multilevel degenerative changes. Mild narrowing of the bilateral neural foramina at C5-6. Upper chest: Negative. Other: No other abnormalities. IMPRESSION: 1. Mild canal narrowing at C5-6 with 10 mm AP diameter. Mild bilateral neural foraminal narrowing at the same level. Multilevel degenerative disc disease. Multilevel facet degenerative changes. No fracture. 2. Minimal anterolisthesis of C3 versus C4 is favored to be due to the facet degenerative changes. Electronically Signed   By: Gerome Sam III M.D   On: 04/23/2016 11:16     ASSESSMENT AND PLAN:    66 year old female with history of EtOH abuse who presented after fall and found to have rhabdomyolysis  1. Acute rhabdomyolysis due to mechanical fall: This is improved with IV fluids  2. Acute encephalopathy with ataxia: This is resolved MR the brain showed no acute stroke Case discussed with neurology and unlikely werneke's encephalopathy   3. Elevated  troponin due to rhabdomyolysis  4. Diabetes: A1c was 8.2 Continue metformin  5. EtOH abuse: Uneventful detox on CIWA protocol  6. Essential hypertension: restart ACEI at discharge.   Management plans discussed with the patient and she is in agreement.  CODE STATUS: FULL  TOTAL TIME TAKING CARE OF THIS PATIENT: 25 minutes.     POSSIBLE D/C today, DEPENDING ON CLINICAL CONDITION.   Milagros Middendorf M.D on 04/25/2016 at 7:22 AM  Between 7am to 6pm - Pager - [phone] After 6pm go to [email or web address] - Social research officer, government  [facility]  Office  [phone]  CC: Primary care physician; No PCP Per Patient  Note: This dictation was prepared with Dragon dictation along with smaller phrase technology. Any transcriptional errors that result from this process are unintentional.

## 2016-04-26 LAB — BASIC METABOLIC PANEL
ANION GAP: 6 (ref 5–15)
BUN: 12 mg/dL (ref 6–20)
CHLORIDE: 103 mmol/L (ref 101–111)
CO2: 30 mmol/L (ref 22–32)
Calcium: 8.5 mg/dL — ABNORMAL LOW (ref 8.9–10.3)
Creatinine, Ser: 0.6 mg/dL (ref 0.44–1.00)
Glucose, Bld: 168 mg/dL — ABNORMAL HIGH (ref 65–99)
POTASSIUM: 3.7 mmol/L (ref 3.5–5.1)
SODIUM: 139 mmol/L (ref 135–145)

## 2016-04-26 LAB — PHOSPHORUS: PHOSPHORUS: 3.2 mg/dL (ref 2.5–4.6)

## 2016-04-26 LAB — MAGNESIUM: MAGNESIUM: 1.9 mg/dL (ref 1.7–2.4)

## 2016-04-26 LAB — GLUCOSE, CAPILLARY
GLUCOSE-CAPILLARY: 136 mg/dL — AB (ref 65–99)
GLUCOSE-CAPILLARY: 159 mg/dL — AB (ref 65–99)

## 2016-04-26 NOTE — Clinical Social Work Note (Signed)
Pt is ready for discharge today and will go [facility]. Pt is aware and agreeable to discharge plan. PASARR has been obtained. Facility has received discharge information and is ready to admit pt. Pt shared that she will contact her support people. RN will call report. [facility] will provide transportation. Pt understands that she will may be billed as insurance may not cover transportation. CSW is signing off as no further needs identified.   Dede Query, MSW, LCSW  Clinicial Social Worker  [phone]

## 2016-04-26 NOTE — Clinical Social Work Placement (Signed)
   CLINICAL SOCIAL WORK PLACEMENT  NOTE  Date:  04/26/2016  Patient Details  Name: Shelby Stewart MRN: [id number] Date of Birth: 13-Nov-1950  Clinical Social Work is seeking post-discharge placement for this patient at the Skilled  Nursing Facility level of care (*CSW will initial, date and re-position this form in  chart as items are completed):  Yes   Patient/family provided with [place] Clinical Social Work Department's list of facilities offering this level of care within the geographic area requested by the patient (or if unable, by the patient's family).  Yes   Patient/family informed of their freedom to choose among providers that offer the needed level of care, that participate in Medicare, Medicaid or managed care program needed by the patient, have an available bed and are willing to accept the patient.  Yes   Patient/family informed of [facility]'s ownership interest in [facility] and [facility], as well as of the fact that they are under no obligation to receive care at these facilities.  PASRR submitted to EDS on 04/25/16     PASRR number received on 04/26/16     Existing PASRR number confirmed on 04/23/16     FL2 transmitted to all facilities in geographic area requested by pt/family on 04/23/16     FL2 transmitted to all facilities within larger geographic area on       Patient informed that his/her managed care company has contracts with or will negotiate with certain facilities, including the following:        Yes   Patient/family informed of bed offers received.  Patient chooses bed at [facility]     Physician recommends and patient chooses bed at      Patient to be transferred to [facility] on 04/26/16.  Patient to be transferred to facility by [facility]     Patient family notified on 04/26/16 of transfer.  Name of family member notified:  Pt will contact her support people.      PHYSICIAN        Additional Comment:    _______________________________________________ Dede Query, LCSW 04/26/2016, 11:54 AM

## 2016-07-17 DEATH — deceased

## 2019-03-01 IMAGING — CR DG KNEE COMPLETE 4+V*R*
4 series · 4 of 4 positions shown · non-contrast
Comparison: No recent prior.

CLINICAL DATA: Bruising.

EXAM:
RIGHT KNEE - COMPLETE 4+ VIEW

[knee ap]
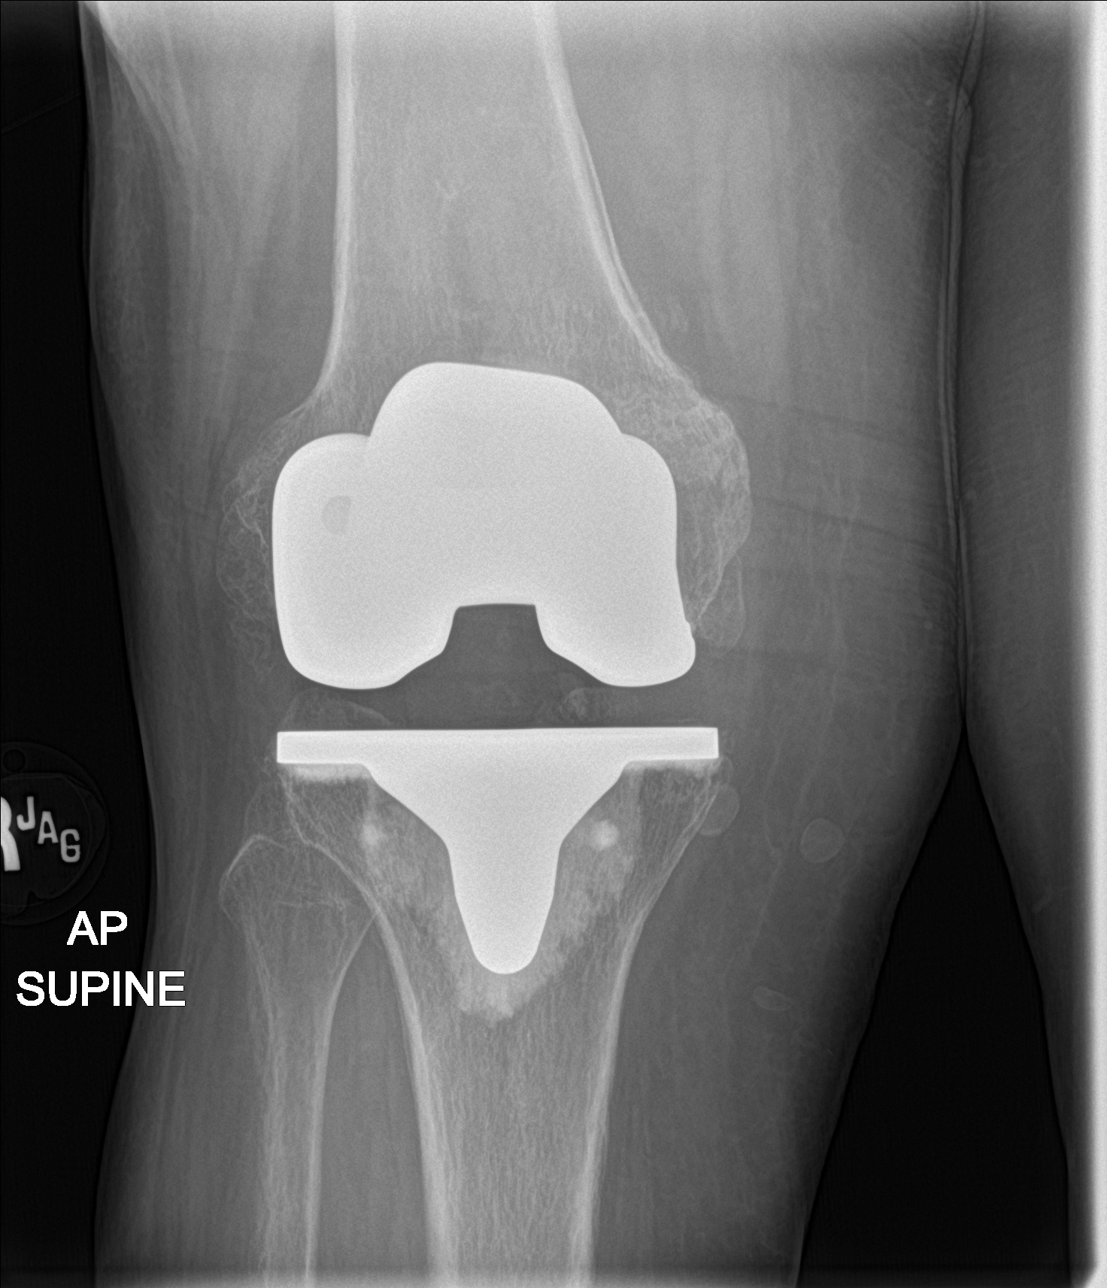

[knee obl (1 of 2)]
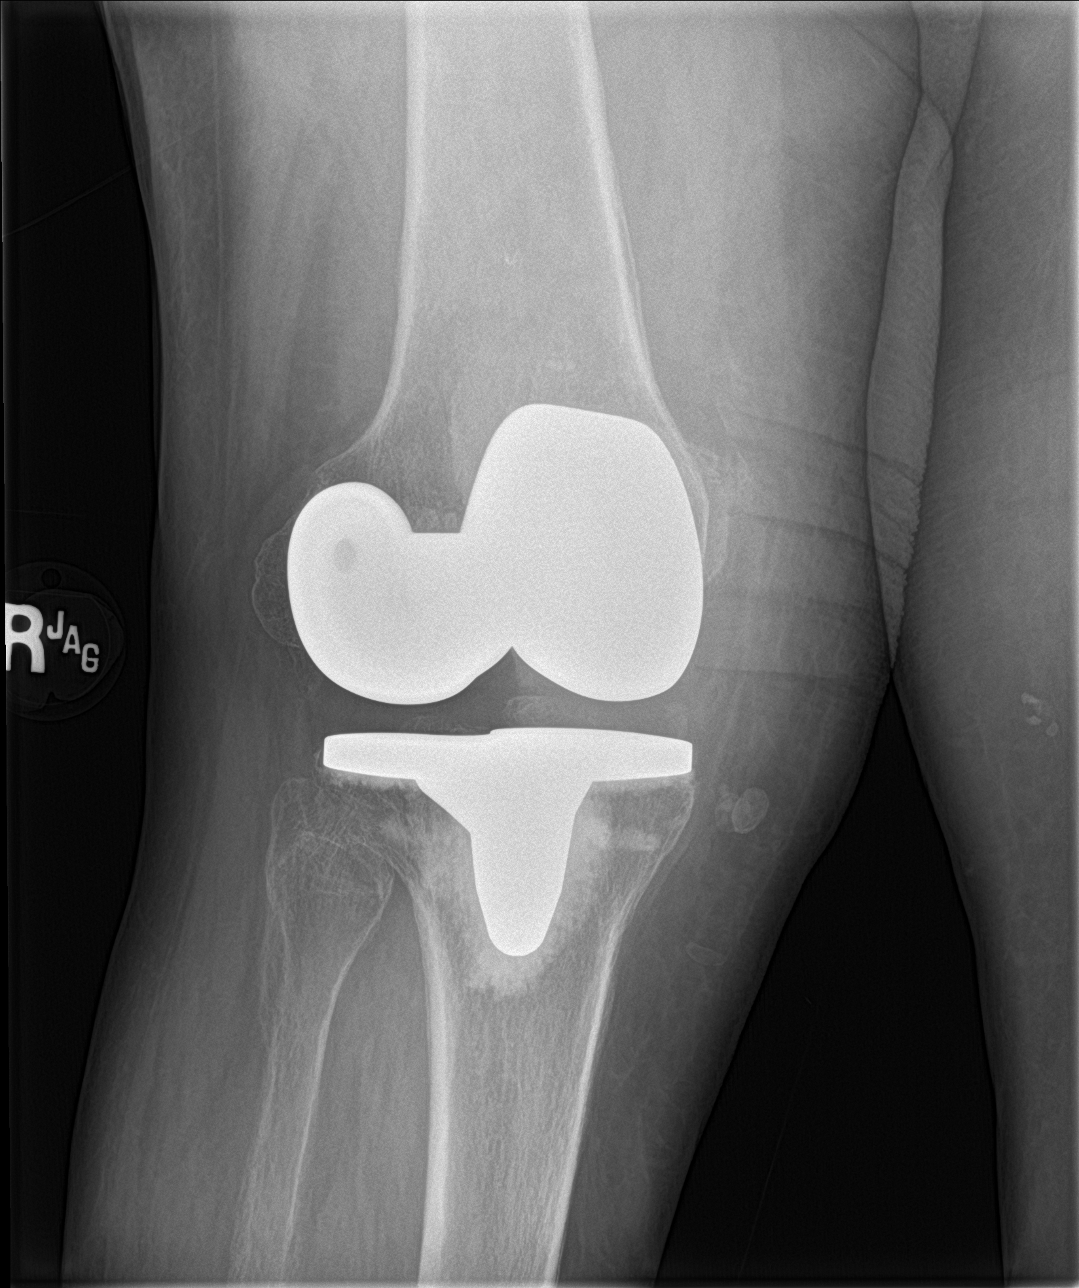

[knee obl (2 of 2)]
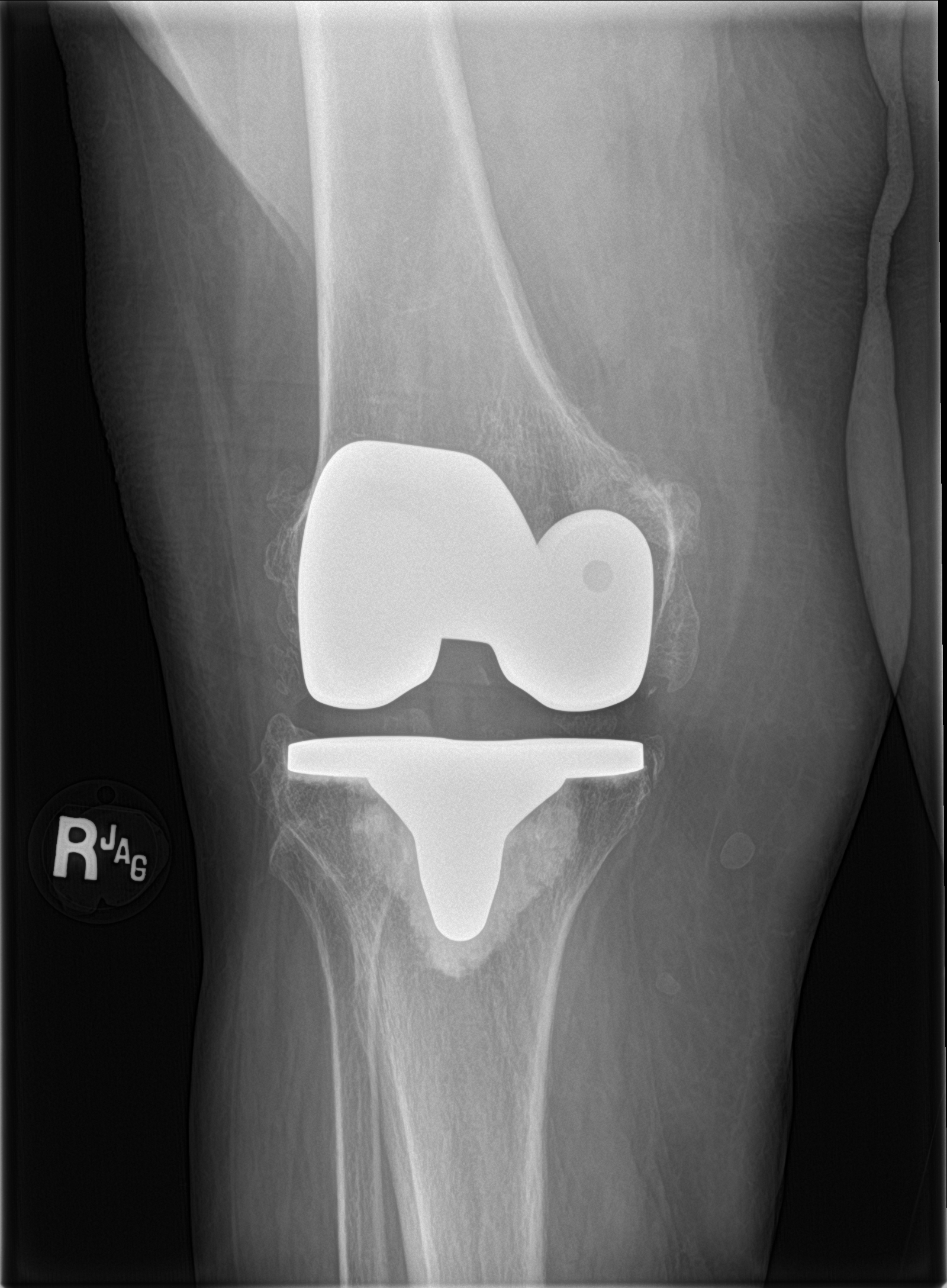

[knee lat]
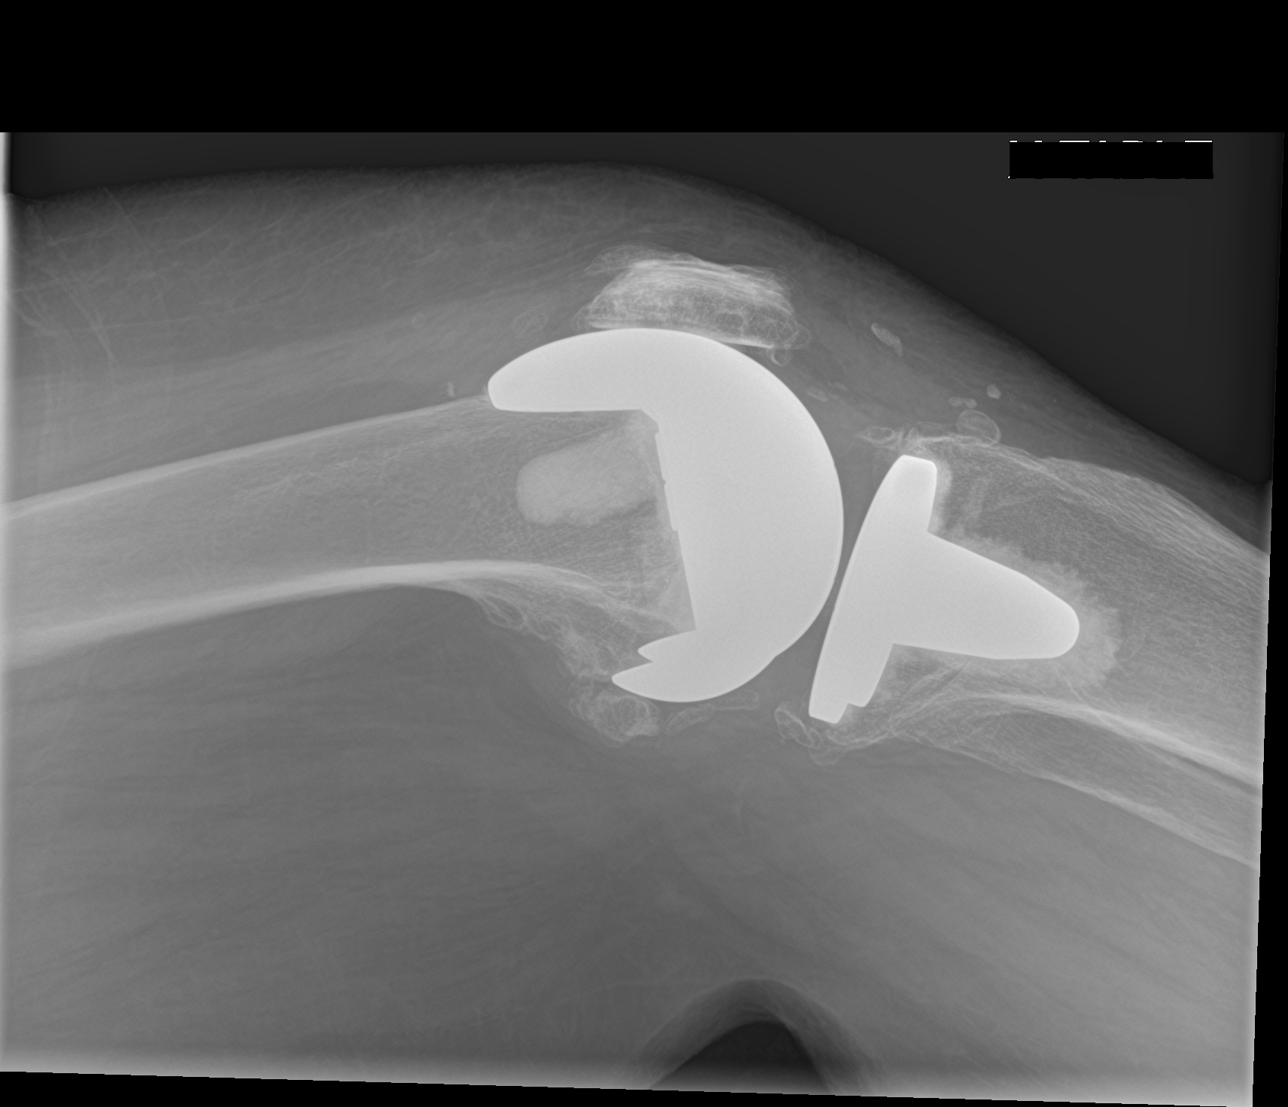

[4 of 4 positions shown; findings below may reference images not displayed]

FINDINGS: Total right knee replacement. Hardware intact. Anatomic alignment.
No acute abnormality identified. Corticated soft tissue
benign-appearing bony densities noted. No prominent effusion.
IMPRESSION: Total right knee replacement. Hardware intact. Anatomic alignment.
No acute abnormality identified.
